# Patient Record
Sex: Female | Born: 1950
Health system: Southern US, Community
[De-identification: ages and names within clinical notes are randomized; demographics above are authoritative.]

## PROBLEM LIST (undated history)

## (undated) DIAGNOSIS — E079 Disorder of thyroid, unspecified: Secondary | ICD-10-CM

## (undated) DIAGNOSIS — H269 Unspecified cataract: Secondary | ICD-10-CM

## (undated) DIAGNOSIS — M940 Chondrocostal junction syndrome [Tietze]: Secondary | ICD-10-CM

## (undated) DIAGNOSIS — K219 Gastro-esophageal reflux disease without esophagitis: Secondary | ICD-10-CM

## (undated) DIAGNOSIS — M1990 Unspecified osteoarthritis, unspecified site: Secondary | ICD-10-CM

## (undated) DIAGNOSIS — I10 Essential (primary) hypertension: Secondary | ICD-10-CM

## (undated) DIAGNOSIS — E785 Hyperlipidemia, unspecified: Secondary | ICD-10-CM

## (undated) DIAGNOSIS — T7840XA Allergy, unspecified, initial encounter: Secondary | ICD-10-CM

## (undated) HISTORY — PX: COLONOSCOPY: SHX174

## (undated) HISTORY — DX: Chondrocostal junction syndrome (tietze): M94.0

## (undated) HISTORY — DX: Gastro-esophageal reflux disease without esophagitis: K21.9

## (undated) HISTORY — DX: Unspecified cataract: H26.9

## (undated) HISTORY — DX: Unspecified osteoarthritis, unspecified site: M19.90

## (undated) HISTORY — DX: Disorder of thyroid, unspecified: E07.9

## (undated) HISTORY — DX: Hyperlipidemia, unspecified: E78.5

## (undated) HISTORY — DX: Allergy, unspecified, initial encounter: T78.40XA

## (undated) HISTORY — PX: POLYPECTOMY: SHX149

## (undated) HISTORY — PX: THYROIDECTOMY: SHX17

## (undated) HISTORY — PX: DILATION AND CURETTAGE OF UTERUS: SHX78

## (undated) HISTORY — DX: Essential (primary) hypertension: I10

---

## 1998-03-09 ENCOUNTER — Ambulatory Visit (HOSPITAL_COMMUNITY): Admission: RE | Admit: 1998-03-09 | Discharge: 1998-03-09 | Payer: Self-pay | Admitting: Family Medicine

## 1998-03-09 ENCOUNTER — Encounter: Payer: Self-pay | Admitting: Family Medicine

## 1998-07-06 ENCOUNTER — Ambulatory Visit (HOSPITAL_COMMUNITY): Admission: RE | Admit: 1998-07-06 | Discharge: 1998-07-06 | Payer: Self-pay | Admitting: *Deleted

## 1999-07-02 ENCOUNTER — Ambulatory Visit (HOSPITAL_COMMUNITY): Admission: RE | Admit: 1999-07-02 | Discharge: 1999-07-02 | Payer: Self-pay | Admitting: Family Medicine

## 1999-07-02 ENCOUNTER — Encounter: Payer: Self-pay | Admitting: Family Medicine

## 2000-01-30 ENCOUNTER — Ambulatory Visit (HOSPITAL_COMMUNITY): Admission: RE | Admit: 2000-01-30 | Discharge: 2000-01-30 | Payer: Self-pay | Admitting: Cardiology

## 2000-02-01 ENCOUNTER — Ambulatory Visit (HOSPITAL_COMMUNITY): Admission: RE | Admit: 2000-02-01 | Discharge: 2000-02-01 | Payer: Self-pay | Admitting: Cardiology

## 2000-02-01 ENCOUNTER — Encounter: Payer: Self-pay | Admitting: Cardiology

## 2000-11-03 ENCOUNTER — Ambulatory Visit (HOSPITAL_COMMUNITY): Admission: RE | Admit: 2000-11-03 | Discharge: 2000-11-03 | Payer: Self-pay | Admitting: Family Medicine

## 2000-11-03 ENCOUNTER — Encounter: Payer: Self-pay | Admitting: Family Medicine

## 2001-11-12 ENCOUNTER — Encounter: Admission: RE | Admit: 2001-11-12 | Discharge: 2001-11-12 | Payer: Self-pay | Admitting: Obstetrics and Gynecology

## 2001-11-26 ENCOUNTER — Encounter: Admission: RE | Admit: 2001-11-26 | Discharge: 2001-11-26 | Payer: Self-pay | Admitting: Obstetrics and Gynecology

## 2002-01-05 ENCOUNTER — Ambulatory Visit (HOSPITAL_COMMUNITY): Admission: RE | Admit: 2002-01-05 | Discharge: 2002-01-05 | Payer: Self-pay | Admitting: Family Medicine

## 2002-01-05 ENCOUNTER — Encounter: Payer: Self-pay | Admitting: Family Medicine

## 2002-01-11 ENCOUNTER — Ambulatory Visit (HOSPITAL_COMMUNITY): Admission: RE | Admit: 2002-01-11 | Discharge: 2002-01-11 | Payer: Self-pay | Admitting: Family Medicine

## 2002-01-11 ENCOUNTER — Encounter: Payer: Self-pay | Admitting: Family Medicine

## 2003-03-03 ENCOUNTER — Other Ambulatory Visit: Admission: RE | Admit: 2003-03-03 | Discharge: 2003-03-03 | Payer: Self-pay | Admitting: Family Medicine

## 2003-03-04 ENCOUNTER — Ambulatory Visit (HOSPITAL_COMMUNITY): Admission: RE | Admit: 2003-03-04 | Discharge: 2003-03-04 | Payer: Self-pay | Admitting: Family Medicine

## 2004-03-23 ENCOUNTER — Ambulatory Visit (HOSPITAL_COMMUNITY): Admission: RE | Admit: 2004-03-23 | Discharge: 2004-03-23 | Payer: Self-pay | Admitting: Family Medicine

## 2004-06-25 ENCOUNTER — Ambulatory Visit: Payer: Self-pay | Admitting: Internal Medicine

## 2004-06-28 ENCOUNTER — Other Ambulatory Visit: Admission: RE | Admit: 2004-06-28 | Discharge: 2004-06-28 | Payer: Self-pay | Admitting: Family Medicine

## 2004-07-04 ENCOUNTER — Encounter (HOSPITAL_COMMUNITY): Admission: RE | Admit: 2004-07-04 | Discharge: 2004-10-02 | Payer: Self-pay | Admitting: Family Medicine

## 2004-08-22 ENCOUNTER — Other Ambulatory Visit: Admission: RE | Admit: 2004-08-22 | Discharge: 2004-08-22 | Payer: Self-pay | Admitting: Interventional Radiology

## 2004-08-22 ENCOUNTER — Encounter (INDEPENDENT_AMBULATORY_CARE_PROVIDER_SITE_OTHER): Payer: Self-pay | Admitting: *Deleted

## 2004-08-22 ENCOUNTER — Encounter: Admission: RE | Admit: 2004-08-22 | Discharge: 2004-08-22 | Payer: Self-pay | Admitting: General Surgery

## 2005-03-06 ENCOUNTER — Ambulatory Visit: Payer: Self-pay | Admitting: Internal Medicine

## 2005-03-13 ENCOUNTER — Ambulatory Visit (HOSPITAL_COMMUNITY): Admission: RE | Admit: 2005-03-13 | Discharge: 2005-03-13 | Payer: Self-pay | Admitting: Family Medicine

## 2005-09-19 ENCOUNTER — Encounter (INDEPENDENT_AMBULATORY_CARE_PROVIDER_SITE_OTHER): Payer: Self-pay | Admitting: Specialist

## 2005-09-19 ENCOUNTER — Ambulatory Visit (HOSPITAL_COMMUNITY): Admission: RE | Admit: 2005-09-19 | Discharge: 2005-09-20 | Payer: Self-pay | Admitting: General Surgery

## 2006-12-19 ENCOUNTER — Ambulatory Visit (HOSPITAL_COMMUNITY): Admission: RE | Admit: 2006-12-19 | Discharge: 2006-12-19 | Payer: Self-pay | Admitting: Family Medicine

## 2007-01-09 ENCOUNTER — Encounter: Admission: RE | Admit: 2007-01-09 | Discharge: 2007-01-09 | Payer: Self-pay | Admitting: Family Medicine

## 2007-12-27 ENCOUNTER — Emergency Department (HOSPITAL_COMMUNITY): Admission: EM | Admit: 2007-12-27 | Discharge: 2007-12-27 | Payer: Self-pay | Admitting: Family Medicine

## 2008-06-03 ENCOUNTER — Ambulatory Visit (HOSPITAL_COMMUNITY): Admission: RE | Admit: 2008-06-03 | Discharge: 2008-06-03 | Payer: Self-pay | Admitting: Family Medicine

## 2009-08-02 ENCOUNTER — Ambulatory Visit (HOSPITAL_COMMUNITY): Admission: RE | Admit: 2009-08-02 | Discharge: 2009-08-02 | Payer: Self-pay | Admitting: Family Medicine

## 2010-04-05 ENCOUNTER — Encounter: Payer: Self-pay | Admitting: Internal Medicine

## 2010-04-05 ENCOUNTER — Ambulatory Visit
Admission: RE | Admit: 2010-04-05 | Discharge: 2010-04-05 | Payer: Self-pay | Source: Home / Self Care | Attending: Internal Medicine | Admitting: Internal Medicine

## 2010-04-05 LAB — CONVERTED CEMR LAB
AST: 19 units/L (ref 0–37)
Sodium: 141 meq/L (ref 135–145)
Total Protein: 6.9 g/dL (ref 6.0–8.3)

## 2010-04-07 ENCOUNTER — Encounter: Payer: Self-pay | Admitting: Family Medicine

## 2010-04-09 ENCOUNTER — Ambulatory Visit
Admission: RE | Admit: 2010-04-09 | Discharge: 2010-04-09 | Payer: Self-pay | Source: Home / Self Care | Attending: Family Medicine | Admitting: Family Medicine

## 2010-04-09 DIAGNOSIS — M67919 Unspecified disorder of synovium and tendon, unspecified shoulder: Secondary | ICD-10-CM | POA: Insufficient documentation

## 2010-04-09 DIAGNOSIS — M719 Bursopathy, unspecified: Secondary | ICD-10-CM

## 2010-04-09 DIAGNOSIS — M6740 Ganglion, unspecified site: Secondary | ICD-10-CM | POA: Insufficient documentation

## 2010-04-19 NOTE — Assessment & Plan Note (Signed)
Summary: Jenna Fowler   Vital Signs:  Patient profile:   60 year old female Height:      68 inches Weight:      206 pounds BMI:     31.44 Pulse rate:   66 / minute BP sitting:   137 / 87  (left arm)  Vitals Entered By: Rochele Pages RN (April 09, 2010 2:25 PM) CC: rt shoulder pain x 5 days   CC:  rt shoulder pain x 5 days.  History of Present Illness: rigt shoulder pain esp with overhead activities for about 5 days. has had some problems before--never this bad. Rigjht hand dominant. Nurwse in OP clinic. No previous injury  no previous surgery  Pain is 4-8/10, worse with overhead activities. No weakness or numbness  right thumb has a mass noted for last couple of weeks. aggravating--mildly ttp. no new activities, no prior problems, no hx hand injury or surgery  Habits & Providers  Alcohol-Tobacco-Diet     Tobacco Status: never  Current Medications (verified): 1)  Vytorin 10-20 Mg Tabs (Ezetimibe-Simvastatin) .... Take 1 By Mouth Once Daily 2)  Hydrochlorothiazide 12.5 Mg Tabs (Hydrochlorothiazide) .... Take 1 By Mouth Once Daily 3)  Aspirin 81 Mg Tabs (Aspirin) .... Take 1 By Mouth Once Daily 4)  Zantac 150 Mg Tabs (Ranitidine Hcl) .... Take 1 By Mouth Once Daily  Allergies (verified): 1)  ! Erythromycin  Past History:  Risk Factors: Smoking Status: never (04/09/2010)  Family History: no hx Rheumatoid arthritis  Social History: Smoking Status:  never  Review of Systems  The patient denies anorexia and fever.    Physical Exam  General:  alert, well-developed, well-nourished, and well-hydrated.   Msk:  RIGHT shoulder RC muscles intact for strength in all planes. Pain with supraspinatus and IR testing. Distally neurovascaulry intact  right thumb firm 1 cm mass mcp joint. US shows cystic structure. No surrounding erythema or warmth   Impression & Recommendations:  Problem # 1:  ROTATOR CUFF SYNDROME, RIGHT (ICD-726.10) HEP, rtc 3 w. Consider  injection then --she did not wanttoday  Problem # 2:  GANGLION CYST (ICD-727.43) quick US reveals ganglion--discussed options. Will rtc as needed for formal US, likely aspioration and injection. Did not want to propceed today as she has to rtw and has to do a lot of typing  at her job  Complete Medication List: 1)  Vytorin 10-20 Mg Tabs (Ezetimibe-simvastatin) .... Take 1 by mouth once daily 2)  Hydrochlorothiazide 12.5 Mg Tabs (Hydrochlorothiazide) .... Take 1 by mouth once daily 3)  Aspirin 81 Mg Tabs (Aspirin) .... Take 1 by mouth once daily 4)  Zantac 150 Mg Tabs (Ranitidine hcl) .... Take 1 by mouth once daily   Orders Added: 1)  New Patient Level III [10272]

## 2010-04-25 NOTE — Miscellaneous (Signed)
Summary: Orders Update/per Adria Dill MD  Clinical Lists Changes  Problems: Added new problem of HYPERCALCEMIA (ICD-275.42) Orders: Added new Test order of T-CMP with estimated GFR ([id number]) - Signed Added new Test order of T-CMP with Estimated GFR ([id number]) - Signed     Process Orders Check Orders Results:     [facility]: ABN not required for this insurance Order queued for requisitioning for [facility]: April 05, 2010 9:58 AM Tests Sent for requisitioning (April 16, 2010 11:47 AM):     04/05/2010: [facility] -- T-CMP with Estimated GFR [[id number]] (signed)

## 2010-08-03 NOTE — Cardiovascular Report (Signed)
[facility]  Patient:    Jenna Fowler, Jenna Fowler                        MRN: [id number] Proc. Date: 02/01/00 Adm. Date:  [id number] Disc. Date: [id number] Attending:  Ophelia Shoulder CC:         Talmadge Coventry, M.D.  Cardiac Catheterization Laboratory   Cardiac Catheterization  PROCEDURES PERFORMED: 1.  Selective coronary angiography by Judkins technique. 2.  Retrograde left heart catheterization. 3.  Left ventricular angiography.  COMPLICATIONS:  None.  ENTRY SITE:  Right femoral artery.  DYE USED:  Omnipaque.  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  She recently had a Cardiolite stress test that showed mild anterolateral ischemia.  Todays outpatient cardiac catheterization was scheduled electively as an outpatient procedure.  RESULTS:  PRESSURES:  The left ventricular pressure was 160/26.  Central aortic pressure was 160/85, with a mean of 120.  No aortic valve gradient by pullback technique.  ANGIOGRAPHIC RESULTS: 1.  The left main coronary artery was virtually nonexistent.  The LAD and     circumflex arose by separate ostia. 2.  The left anterior descending artery coursed the cardiac apex and gave     rise to two tiny diagonal branches.  No lesions were noted. 3.  There was a medium sized intermediate branch which coursed almost all     the way to the anterolateral apical portion of the left ventricle.  No     lesions were seen. 4.  The circumflex itself consisted of posterolateral and posterior descending     branches and this vessel was dominant.  No lesions were seen. 5.  The right coronary artery was small and nondominant with no lesions noted. 6.  The left ventricle showed normal contractility, with no wall motion     abnormalities.  The ejection fraction estimate is 70%.  There was no     mitral regurgitation noted.  No evidence of mitral  prolapse.  There was     no left ventricular thrombus noted.  FINAL DIAGNOSES: 1.  Angiographically patent coronary arteries with a circumflex dominant     system. 2.  Normal left ventricular systolic function. DD:  02/01/00 TD:  02/01/00 Job: [id number]

## 2010-08-03 NOTE — Op Note (Signed)
Jenna Fowler, Jenna Fowler               ACCOUNT NO.:  [id number]   MEDICAL RECORD NO.:  [id number]          PATIENT TYPE:  AMB   LOCATION:  SDS                          FACILITY:  [facility]   PHYSICIAN:  Leonie Man, M.D.   DATE OF BIRTH:  1950/11/05   DATE OF PROCEDURE:  09/19/2005  DATE OF DISCHARGE:                                 OPERATIVE REPORT   PREOPERATIVE DIAGNOSIS:  Multinodular goiter.   POSTOPERATIVE DIAGNOSIS:  Multinodular goiter with pathology pending.   PROCEDURE:  Total thyroidectomy.   SURGEON:  Leonie Man, M.D.   ASSISTANCE:  Currie Paris, M.D.   ANESTHESIA:  General.   INDICATIONS:  Ms. Mohiuddin is a 60 year old woman with increasingly enlarged  thyroid with the right lobe extending up to the angle of the mandible and  down into the mediastinum and the left lobe being somewhat smaller,  extending upward to just below the mandibular angle and down to the sternal  notch.  The patient is having some problems with compression symptoms.  She  has significant tracheal deviation to the left.  She underwent biopsy of  this nodule which shows totally benign features consistent with a  multinodular goiter.  The patient comes to the operating room now after the  risks and potential benefits of surgery have been discussed including the  risk of hypoparathyroidism, hypothyroidism, as well as recurrent laryngeal  nerve injury.  She understands the risks and potential benefits and gives  her consent to surgery.   PROCEDURE:  Following the induction of satisfactory general anesthesia, the  patient was positioned supinely and head and neck hyperextended.  The neck  was prepped and draped to be included in a sterile operative field.  A  transverse collar incision is carried down in the clinic crease  approximately two fingerbreadths above the sternal notch.  This was deepened  through skin and subcutaneous tissue and across the placenta muscle.  Superior flaps were  raised up to the thyroid cartilage and an inferior flap  was carried down to the sternal notch.  The strap muscles are opened in  their midline carrying this down to the sternal notch and up to the thyroid  cartilage.  Dissection was carried down on the right side primarily where  the larger thyroid mass was located.  This was dissected free and mobilized  carrying the mobilization up to the superior pole where the superior pole  vessels were tied with 2-0 silk sutures and clipped.  The superior pole then  having been mobilized.  There was a large blood vessel to the pyramidal lobe  which was similarly doubly tied and clipped.  Dissection was carried down  along the thyroid taking blood vessels between clips to secure hemostasis at  the thyroid was carried forward.  The recurrent laryngeal nerve was  identified and protected throughout the course of the dissection.  The lower  pole parathyroid glands were also detected. parathyroid gland was also  protected and protected throughout the course of the dissection.  The upper  pole parathyroid gland was not clearly seen.  Dissection was carried then  across the trachea.  Tracheal midline and the lower pole vessels were taken  between clips and the thyroid IMA vessels were secured with medium clips.   Attention was then turned to the left side of the neck where similarly the  strap muscles were retracted laterally and the thyroid elevated from the  tracheal groove.  The dissection carried up toward the superior pole.  Superior pole vessels were identified and doubly tied and clipped.  The  middle thyroid vein was identified and doubly clipped.  Both the upper and  lower parathyroid vessels were identified during this dissection as well as  the recurrent laryngeal nerve.  Dissection carried forward maintaining  hemostasis with clips and as the thyroid was rolled medially toward the  isthmus.  The lower pole vessels were taken with clips and the  thyroid then  dissected free from the trachea, removed and forwarded for pathologic  evaluation.  Hemostasis was assured both with electrocautery and additional  clips.  Both the right and left neck were irrigated with normal saline and I  placed two Surgicel patches within the neck for additional hemostasis.  Sponge and instrument counts were verified.  The midline strap muscles  closed with running 3-0 Vicryl suture.  The platysma muscle closed with  interrupted 3-0 Vicryl sutures and the skin was closed with a running 5-0  Monocryl suture.  The wound was then steri-stripped.  Sterile dressings  applied.  The anesthetic reversed.  The patient removed from the operating  room to the recovery room in stable condition.  She tolerated the procedure  well.      Leonie Man, M.D.  Electronically Signed     PB/MEDQ  D:  09/19/2005  T:  09/19/2005  Job:  [id number]

## 2010-10-24 ENCOUNTER — Other Ambulatory Visit: Payer: Self-pay

## 2010-10-24 DIAGNOSIS — E039 Hypothyroidism, unspecified: Secondary | ICD-10-CM

## 2010-10-24 DIAGNOSIS — E785 Hyperlipidemia, unspecified: Secondary | ICD-10-CM

## 2010-10-24 DIAGNOSIS — E559 Vitamin D deficiency, unspecified: Secondary | ICD-10-CM

## 2010-10-24 DIAGNOSIS — I10 Essential (primary) hypertension: Secondary | ICD-10-CM

## 2010-10-24 LAB — LIPID PANEL
Cholesterol: 176 mg/dL (ref 0–200)
HDL: 57 mg/dL (ref >39)
Total CHOL/HDL Ratio: 3.1 Ratio
Triglycerides: 104 mg/dL (ref <150)

## 2010-10-24 LAB — TSH: TSH: 0.593 u[IU]/mL (ref 0.350–4.500)

## 2010-10-25 LAB — URINALYSIS, ROUTINE W REFLEX MICROSCOPIC
Glucose, UA: NEGATIVE mg/dL
Hgb urine dipstick: NEGATIVE
Ketones, ur: NEGATIVE mg/dL
Nitrite: NEGATIVE
Specific Gravity, Urine: 1.023 (ref 1.005–1.030)
pH: 5 (ref 5.0–8.0)

## 2010-10-25 LAB — COMPLETE METABOLIC PANEL WITH GFR
Calcium: 10.2 mg/dL (ref 8.4–10.5)
Chloride: 104 mEq/L (ref 96–112)
GFR, Est African American: >60 mL/min (ref >60)
Potassium: 3.8 mEq/L (ref 3.5–5.3)
Sodium: 139 mEq/L (ref 135–145)
Total Bilirubin: 0.7 mg/dL (ref 0.3–1.2)

## 2010-10-25 LAB — VITAMIN D 25 HYDROXY (VIT D DEFICIENCY, FRACTURES): Vit D, 25-Hydroxy: 32 ng/mL (ref 30–89)

## 2010-10-25 LAB — URINALYSIS, MICROSCOPIC ONLY
Bacteria, UA: NONE SEEN
Squamous Epithelial / LPF: NONE SEEN

## 2010-10-25 LAB — MICROALBUMIN / CREATININE URINE RATIO
Microalb Creat Ratio: 9.8 mg/g (ref 0.0–30.0)
Microalb, Ur: 2.18 mg/dL — ABNORMAL HIGH (ref 0.00–1.89)

## 2010-12-17 LAB — CULTURE, ROUTINE-ABSCESS

## 2011-10-24 ENCOUNTER — Ambulatory Visit (HOSPITAL_COMMUNITY)
Admission: RE | Admit: 2011-10-24 | Discharge: 2011-10-24 | Disposition: A | Discharge status: Home / Self Care | Payer: 59 | Source: Ambulatory Visit | Attending: Sports Medicine | Admitting: Sports Medicine

## 2011-10-24 ENCOUNTER — Ambulatory Visit (INDEPENDENT_AMBULATORY_CARE_PROVIDER_SITE_OTHER): Payer: 59 | Admitting: Sports Medicine

## 2011-10-24 VITALS — BP 139/74 | Ht 67.0 in | Wt 215.0 lb

## 2011-10-24 DIAGNOSIS — M25569 Pain in unspecified knee: Secondary | ICD-10-CM

## 2011-10-24 DIAGNOSIS — M25562 Pain in left knee: Secondary | ICD-10-CM

## 2011-10-24 DIAGNOSIS — S82009A Unspecified fracture of unspecified patella, initial encounter for closed fracture: Secondary | ICD-10-CM

## 2011-10-24 MED ORDER — MELOXICAM 15 MG PO TABS: 15.0000 mg | ORAL_TABLET | Freq: Every day | ORAL | Status: AC

## 2011-10-24 NOTE — Progress Notes (Signed)
Subjective:  Jenna Fowler is a pleasant 61 y/o AAF who presents to clinic with acute left knee pain following a fall one week ago.  One week ago she states she was walking up onto a side walk and fell on her left knee.  She has some pain acutely but noted no swelling.  She scraped a small portion of her knee just below the patella.  She has been having more anterior knee pain since, which is worse at the end of the day and with prolonged standing.  She did note small amount of swelling initially but this resolved very quickly with elevation.  She denies and locking, popping or feeling of instability on her knee.  No history of trauma or injury to her knee before.  Denies any leg numbness or tingling, no weakness.  She has been using tylenol arthritis and alternating with Ibuprofen which is giving her moderate relief of her pain.  She was advise to be seen given she is still having some discomfort.    OBJ: Filed Vitals:   10/24/11 0951  BP: 139/74    Gen: NAD, well-developed, well-nourished. Awake alert and oriented x3. Left Knee: no visible deformity or swelling noted.  Has full ROM.  Neurovascularly intact distally. TTP over inferior patellar tendon, some mild medial tibial joint line tenderness.  Negative patellar apprehension test and the patella isn't ballotable.  MCL, LCL, PCL and ACL intact with firm endpoints and without tenderness.  McMurray's difficult to assess 2/2 to patient inability to relax leg, however does have some mild discomfort in anterior knee with hop test and Thessaly's.  Normal reflexes and sensation. Skin is intact. Neurovascularly intact distally.  Knee ultrasound - views over the patellar tendon proximally show mild inflammation.  Has a possible small patellar avulsion vs. Bone spur distally.  No appreciation for increase vascularization over patella or patellar tendon.  A/P Left knee pain with likely bone contusion, patellar tendonitis and possible bone spur vs. Avulsion on  distal patella Will obtain plain films of left knee to better asses for possible avulsion vs. Bone spur vs. Possible arthritis to better explain patient's pain.  Will treat with rest, ice, elevation.  Have patient keep a compression sleeve over knee for comfort and support.  Will have patient use Mobic for antiinflammatory along with tylenol as needed.  Plan to f/u on plain films.  Gave patient instruction on expected management and to continue routine activities.  Plan to have patient follow up as needed in 2-3 weeks if pain not improving or having any other concerning symptoms that aren't resolving.  Patient understands plan and agrees.  Plan Addendum -   F/u on Plain films - shows mild narrowing of medial joint compartment. Small lucency at the inferolateral aspect of the patella suspicious for  nondisplaced fracture.  Will have patient return to clinic and place in knee immobilizer for 3 weeks.  Plan to f/u in 3 weeks and re-image. Three month temporary handicap placard given.

## 2011-10-24 NOTE — Addendum Note (Signed)
Addended by: Annita Brod on: 10/24/2011 01:59 PM   Modules accepted: Orders

## 2011-10-28 ENCOUNTER — Encounter: Payer: Self-pay | Admitting: *Deleted

## 2011-11-13 ENCOUNTER — Ambulatory Visit (HOSPITAL_COMMUNITY)
Admission: RE | Admit: 2011-11-13 | Discharge: 2011-11-13 | Disposition: A | Discharge status: Home / Self Care | Payer: 59 | Source: Ambulatory Visit | Attending: Sports Medicine | Admitting: Sports Medicine

## 2011-11-13 DIAGNOSIS — X58XXXA Exposure to other specified factors, initial encounter: Secondary | ICD-10-CM | POA: Insufficient documentation

## 2011-11-13 DIAGNOSIS — M25562 Pain in left knee: Secondary | ICD-10-CM

## 2011-11-13 DIAGNOSIS — S82009A Unspecified fracture of unspecified patella, initial encounter for closed fracture: Secondary | ICD-10-CM | POA: Insufficient documentation

## 2011-11-14 ENCOUNTER — Encounter: Payer: Self-pay | Admitting: Sports Medicine

## 2011-11-14 ENCOUNTER — Ambulatory Visit (INDEPENDENT_AMBULATORY_CARE_PROVIDER_SITE_OTHER): Payer: 59 | Admitting: Sports Medicine

## 2011-11-14 VITALS — BP 125/82 | HR 67 | Ht 69.0 in | Wt 215.0 lb

## 2011-11-14 DIAGNOSIS — M25569 Pain in unspecified knee: Secondary | ICD-10-CM

## 2011-11-14 DIAGNOSIS — S82009A Unspecified fracture of unspecified patella, initial encounter for closed fracture: Secondary | ICD-10-CM

## 2011-11-14 DIAGNOSIS — M25562 Pain in left knee: Secondary | ICD-10-CM

## 2011-11-15 NOTE — Progress Notes (Signed)
  Subjective:    Patient ID: Jenna Fowler, female    DOB: 06/12/1950, 61 y.o.   MRN: [id number]  HPI Patient comes in today for followup on a left patellar fracture. Overall she feels like she is improving. Swelling has decreased. She is in a knee immobilizer. She is without complaint today.    Review of Systems     Objective:   Physical Exam Well-developed, well-nourished. No acute distress  Left knee: No effusion, no soft tissue swelling. Extensor mechanism is intact. Mild tenderness to palpation over the inferior pole of the patella. Neurovascular intact distally. Fully weightbearing in her knee immobilizer.  X-rays of the left knee done on August 28 were independently reviewed by me. There's been no change in her fracture position. Fracture remains nondisplaced.       Assessment & Plan:  1. 3 weeks status post nondisplaced patella fracture, left knee  Continue in the immobilizer for 3 more weeks. We will repeat x-rays prior to her followup visit in 3 weeks time. She will call with questions or concerns in the interim.

## 2011-12-04 ENCOUNTER — Ambulatory Visit: Payer: 59 | Admitting: Sports Medicine

## 2011-12-04 ENCOUNTER — Ambulatory Visit (HOSPITAL_COMMUNITY)
Admission: RE | Admit: 2011-12-04 | Discharge: 2011-12-04 | Disposition: A | Discharge status: Home / Self Care | Payer: 59 | Source: Ambulatory Visit | Attending: Sports Medicine | Admitting: Sports Medicine

## 2011-12-04 DIAGNOSIS — M25562 Pain in left knee: Secondary | ICD-10-CM

## 2011-12-04 DIAGNOSIS — Z09 Encounter for follow-up examination after completed treatment for conditions other than malignant neoplasm: Secondary | ICD-10-CM | POA: Insufficient documentation

## 2011-12-05 ENCOUNTER — Ambulatory Visit (INDEPENDENT_AMBULATORY_CARE_PROVIDER_SITE_OTHER): Payer: 59 | Admitting: Sports Medicine

## 2011-12-05 DIAGNOSIS — S82009A Unspecified fracture of unspecified patella, initial encounter for closed fracture: Secondary | ICD-10-CM

## 2011-12-06 NOTE — Progress Notes (Signed)
  Subjective:    Patient ID: Jenna Fowler, female    DOB: 29-Aug-1950, 61 y.o.   MRN: [id number]  HPI chief complaint: Followup on patella fracture  Patient comes in today for followup on her left patellar fracture. She's doing well. Minimal pain. She's been in a knee immobilizer for 6 weeks. She is without complaint.    Review of Systems     Objective:   Physical Exam Well-developed, well-nourished. No acute distress. Awake alert and oriented x3  Left knee: Range of motion 0-90. No effusion. No soft tissue swelling. Minimal tenderness over the inferior pole patella but not markedly. Extensor mechanism is intact. Skin is intact. She is neurovascular intact distally and fully weightbearing in her knee immobilizer.  X-rays of her left knee including AP and lateral views dated 12/04/2011 are reviewed. The fracture previously seen at the inferior pole the patella is now healed. She still has a rather prominent degenerative spur. No joint effusion.       Assessment & Plan:  1. Radiographically healed nondisplaced left patella fracture  Discontinue the immobilizer in favor of a simple double hinged knee brace. Start physical therapy to increase motion and strengthening as tolerated. Followup with me in 4 weeks for reevaluation. Call with questions or concerns in the interim.

## 2011-12-17 ENCOUNTER — Ambulatory Visit: Payer: 59 | Attending: Sports Medicine | Admitting: Physical Therapy

## 2011-12-17 DIAGNOSIS — M25569 Pain in unspecified knee: Secondary | ICD-10-CM | POA: Insufficient documentation

## 2011-12-17 DIAGNOSIS — IMO0001 Reserved for inherently not codable concepts without codable children: Secondary | ICD-10-CM | POA: Insufficient documentation

## 2011-12-17 DIAGNOSIS — M25669 Stiffness of unspecified knee, not elsewhere classified: Secondary | ICD-10-CM | POA: Insufficient documentation

## 2011-12-26 ENCOUNTER — Encounter: Payer: 59 | Admitting: Physical Therapy

## 2011-12-27 ENCOUNTER — Encounter: Payer: 59 | Admitting: Rehabilitation

## 2012-01-01 ENCOUNTER — Ambulatory Visit: Payer: 59 | Admitting: Physical Therapy

## 2012-01-02 ENCOUNTER — Ambulatory Visit (INDEPENDENT_AMBULATORY_CARE_PROVIDER_SITE_OTHER): Payer: 59 | Admitting: Sports Medicine

## 2012-01-02 VITALS — BP 128/80 | Ht 68.0 in | Wt 215.0 lb

## 2012-01-02 DIAGNOSIS — S82009A Unspecified fracture of unspecified patella, initial encounter for closed fracture: Secondary | ICD-10-CM

## 2012-01-02 NOTE — Progress Notes (Signed)
  Subjective:    Patient ID: Jenna Fowler, female    DOB: Jun 02, 1950, 61 y.o.   MRN: [id number]  HPI Patient comes in today for followup on left patellar fracture. Injury occurred at the beginning of August. She is in a double upright brace and has attended 2 sessions of physical therapy. She feels like she is 20% improved since her last office visit. Denies significant pain with walking. No swelling. No feelings of instability. She is without complaint today.    Review of Systems     Objective:   Physical Exam Well-developed, well-nourished. No acute distress  Left knee: Range of motion is 0-120 to 130 degrees. No effusion. No soft tissue swelling. She is still tender to palpation over the inferior pole of the patella. Extensor mechanism is intact. Moderate amount of quad atrophy on the left compared to the right. She walks with a slight limp.  X-rays one month ago showed a healed fracture       Assessment & Plan:  1. Status post left patellar fracture  Patient will continue with physical therapy. In 2 weeks she will be 3 months out from her injury and I will allow her to transition into a body helix compression sleeve as long as the knee is not giving way. I will see her in 6 weeks for followup she is instructed to call me in the interim if she does not continue to improve.

## 2012-01-08 ENCOUNTER — Ambulatory Visit: Payer: 59 | Admitting: Physical Therapy

## 2012-01-15 ENCOUNTER — Ambulatory Visit: Payer: 59 | Admitting: Physical Therapy

## 2012-01-22 ENCOUNTER — Ambulatory Visit: Payer: 59 | Attending: Internal Medicine | Admitting: Physical Therapy

## 2012-01-22 DIAGNOSIS — M25669 Stiffness of unspecified knee, not elsewhere classified: Secondary | ICD-10-CM | POA: Insufficient documentation

## 2012-01-22 DIAGNOSIS — IMO0001 Reserved for inherently not codable concepts without codable children: Secondary | ICD-10-CM | POA: Insufficient documentation

## 2012-01-22 DIAGNOSIS — M25569 Pain in unspecified knee: Secondary | ICD-10-CM | POA: Insufficient documentation

## 2012-02-17 ENCOUNTER — Ambulatory Visit (INDEPENDENT_AMBULATORY_CARE_PROVIDER_SITE_OTHER): Payer: 59 | Admitting: Sports Medicine

## 2012-02-17 ENCOUNTER — Encounter: Payer: Self-pay | Admitting: Sports Medicine

## 2012-02-17 VITALS — BP 121/79 | HR 76 | Ht 68.0 in | Wt 215.0 lb

## 2012-02-17 DIAGNOSIS — S82009A Unspecified fracture of unspecified patella, initial encounter for closed fracture: Secondary | ICD-10-CM

## 2012-02-17 NOTE — Progress Notes (Signed)
  Subjective:    Patient ID: Jenna Fowler, female    DOB: 11/01/1950, 61 y.o.   MRN: [id number]  HPI  Patient is 61 yo F s/p fall in August 2013 presenting for follow up for patellar fracture of left knee. She has been in body helix brace for 2-3 weeks now. She also finished her PT 2 weeks ago. She has been doing some home exercises. She is now able to walk, climb stairs and kneel with very little discomfort. A few days ago she was standing more than usual and had pain in knee which improved with Mobic, but otherwise she has no complaints. She denies weakness of leg or sensation of her knee giving out.    Review of Systems Negative except as noted in HPI above    Objective:   Physical Exam  Left knee: Mild quad atrophy compared to right. Range of motion 0-120 degrees. + crepitus of knee. Minimal tenderness to palpation of distal patella/patellar tendon. 5/5 strength. Walking without a limp.     Assessment & Plan:   61 yo F s/p left patellar fracture, healing well  1. Continue to use body helix brace. (Given Medium size brace today; large was too big) 2. Continue exercises to increase quad strength. At this time, no need for further PT sessions 3. Continue Mobic as needed for pain/inflammation 4. Return to clinic as needed   [employer] [place], M.D. 02/17/2012 11:58 AM

## 2012-02-20 ENCOUNTER — Encounter: Payer: Self-pay | Admitting: Sports Medicine

## 2012-02-20 ENCOUNTER — Ambulatory Visit (INDEPENDENT_AMBULATORY_CARE_PROVIDER_SITE_OTHER): Payer: 59 | Admitting: Sports Medicine

## 2012-02-20 VITALS — BP 122/80 | HR 67 | Ht 68.0 in | Wt 215.0 lb

## 2012-02-20 DIAGNOSIS — M25569 Pain in unspecified knee: Secondary | ICD-10-CM

## 2012-02-21 DIAGNOSIS — M25569 Pain in unspecified knee: Secondary | ICD-10-CM | POA: Insufficient documentation

## 2012-02-21 NOTE — Assessment & Plan Note (Signed)
For next 2 weeks use her knee support on the RT knee This seems to help Use Ice Prn OTC medicines for pain  Simple SLR, lateral leg raise, and quad sets  Sports insoles to cushion shoes more  Reck in 2 weeks and [place] if not improving

## 2012-02-21 NOTE — Progress Notes (Signed)
Patient ID: Jenna Fowler, female   DOB: 1950-12-26, 61 y.o.   MRN: [id number]  Patient enters for RT knee swelling and moderate pain This arose without any trauma She did a lot of walking in some shoes that did not feel that good  She had seen Dr Margaretha Sheffield recnelty for left patellar fx that healed well  This was not assoc with a fall as was her patellar fx  She works in [employer] and stands on hard floors Feels better in sports shoes  Examination  NAD  RT Knee - mild puffiness and TTP along medial joint line Otherwise Palpation normal with no warmth or joint line tenderness or patellar tenderness or condyle tenderness. ROM normal in extension Mild limitation of flexion and pain at 125 deg Mild pain with lower leg rotation. Ligaments with solid consistent endpoints including ACL, PCL, LCL, MCL. Positive Mcmurray's and provocative meniscal tests. Non painful patellar compression. Patellar and quadriceps tendons unremarkable. Hamstring and quadriceps strength is normal.  Able to walk without limp

## 2012-03-19 ENCOUNTER — Other Ambulatory Visit (HOSPITAL_COMMUNITY): Payer: Self-pay | Admitting: Internal Medicine

## 2012-03-19 DIAGNOSIS — Z1231 Encounter for screening mammogram for malignant neoplasm of breast: Secondary | ICD-10-CM

## 2012-04-03 ENCOUNTER — Ambulatory Visit (HOSPITAL_COMMUNITY)
Admission: RE | Admit: 2012-04-03 | Discharge: 2012-04-03 | Disposition: A | Discharge status: Home / Self Care | Payer: 59 | Source: Ambulatory Visit | Attending: Internal Medicine | Admitting: Internal Medicine

## 2012-04-03 DIAGNOSIS — Z1231 Encounter for screening mammogram for malignant neoplasm of breast: Secondary | ICD-10-CM | POA: Insufficient documentation

## 2013-02-26 ENCOUNTER — Other Ambulatory Visit: Payer: Self-pay

## 2013-02-26 DIAGNOSIS — Z1231 Encounter for screening mammogram for malignant neoplasm of breast: Secondary | ICD-10-CM

## 2013-03-08 ENCOUNTER — Other Ambulatory Visit: Payer: Self-pay | Admitting: *Deleted

## 2013-03-08 MED ORDER — MELOXICAM 15 MG PO TABS: 15.0000 mg | ORAL_TABLET | Freq: Every day | ORAL | Status: DC

## 2013-04-05 ENCOUNTER — Ambulatory Visit
Admission: RE | Admit: 2013-04-05 | Discharge: 2013-04-05 | Disposition: A | Discharge status: Home / Self Care | Payer: 59 | Source: Ambulatory Visit

## 2013-04-05 DIAGNOSIS — Z1231 Encounter for screening mammogram for malignant neoplasm of breast: Secondary | ICD-10-CM

## 2013-05-19 ENCOUNTER — Other Ambulatory Visit (HOSPITAL_COMMUNITY)
Admission: RE | Admit: 2013-05-19 | Discharge: 2013-05-19 | Disposition: A | Discharge status: Home / Self Care | Payer: 59 | Source: Ambulatory Visit | Attending: Internal Medicine | Admitting: Internal Medicine

## 2013-05-19 ENCOUNTER — Other Ambulatory Visit: Payer: Self-pay | Admitting: Internal Medicine

## 2013-05-19 DIAGNOSIS — Z01419 Encounter for gynecological examination (general) (routine) without abnormal findings: Secondary | ICD-10-CM | POA: Insufficient documentation

## 2013-05-19 DIAGNOSIS — Z1151 Encounter for screening for human papillomavirus (HPV): Secondary | ICD-10-CM | POA: Insufficient documentation

## 2013-08-18 ENCOUNTER — Encounter: Payer: Self-pay | Admitting: Internal Medicine

## 2014-07-28 ENCOUNTER — Other Ambulatory Visit: Payer: Self-pay

## 2014-07-28 DIAGNOSIS — Z1231 Encounter for screening mammogram for malignant neoplasm of breast: Secondary | ICD-10-CM

## 2014-08-03 ENCOUNTER — Encounter: Payer: Self-pay | Admitting: Internal Medicine

## 2014-08-19 ENCOUNTER — Ambulatory Visit
Admission: RE | Admit: 2014-08-19 | Discharge: 2014-08-19 | Disposition: A | Discharge status: Home / Self Care | Payer: 59 | Source: Ambulatory Visit

## 2014-08-19 DIAGNOSIS — Z1231 Encounter for screening mammogram for malignant neoplasm of breast: Secondary | ICD-10-CM

## 2014-09-30 ENCOUNTER — Ambulatory Visit (AMBULATORY_SURGERY_CENTER): Payer: Self-pay | Admitting: *Deleted

## 2014-09-30 VITALS — Ht 68.0 in | Wt 199.0 lb

## 2014-09-30 DIAGNOSIS — Z1211 Encounter for screening for malignant neoplasm of colon: Secondary | ICD-10-CM

## 2014-09-30 NOTE — Progress Notes (Signed)
No egg or soy allergy After thyroid surgery pt had nausea but no other issues with sedation No diet pills No home 02 emmi video to e mail

## 2014-10-10 ENCOUNTER — Telehealth: Payer: Self-pay | Admitting: Internal Medicine

## 2014-10-10 ENCOUNTER — Other Ambulatory Visit: Payer: Self-pay

## 2014-10-10 MED ORDER — POLYETHYLENE GLYCOL 3350 17 GM/SCOOP PO POWD: ORAL | Status: DC

## 2014-10-10 MED ORDER — BISACODYL 5 MG PO TBEC: 5.0000 mg | DELAYED_RELEASE_TABLET | Freq: Every day | ORAL | Status: DC | PRN

## 2014-10-10 NOTE — Telephone Encounter (Signed)
Rx sent to the pharmacy as per the prep instructions.

## 2014-10-14 ENCOUNTER — Encounter: Payer: 59 | Admitting: Internal Medicine

## 2015-04-13 MED FILL — AMLODIPINE BESYLATE 2.5 MG: 2.5 | 90 days supply | Qty: 90 | Fill #1

## 2015-04-13 MED FILL — LEVOTHYROXINE 88 MCG TABLET: 88 | 60 days supply | Qty: 60 | Fill #1

## 2015-04-19 DIAGNOSIS — E039 Hypothyroidism, unspecified: Secondary | ICD-10-CM | POA: Diagnosis not present

## 2015-05-17 MED FILL — raNITIdine HCL 150 MG TABS: 150 | 90 days supply | Qty: 90 | Fill #1

## 2015-06-14 MED FILL — LEVOTHYROXINE 88 MCG TABLET: 88 | 60 days supply | Qty: 60 | Fill #0

## 2015-07-06 MED FILL — EZETIMIBE 10 MG TABLET: 10 | 90 days supply | Qty: 90 | Fill #1

## 2015-07-06 MED FILL — SIMVASTATIN 20 MG TABLET: 20 | 90 days supply | Qty: 90 | Fill #1

## 2015-07-17 MED FILL — AMLODIPINE BESYLATE 2.5 MG: 2.5 | 90 days supply | Qty: 90 | Fill #0

## 2015-08-09 MED FILL — raNITIdine HCL 150 MG TABS: 150 | 90 days supply | Qty: 90 | Fill #2

## 2015-08-09 MED FILL — LEVOTHYROXINE 88 MCG TABLET: 88 | 60 days supply | Qty: 60 | Fill #1

## 2015-08-10 MED FILL — MELOXICAM 7.5 MG TABLET: 7.5 | 30 days supply | Qty: 30 | Fill #0

## 2015-10-06 MED FILL — LEVOTHYROXINE 88 MCG TABLET: 88 | 60 days supply | Qty: 60 | Fill #2

## 2015-10-10 ENCOUNTER — Other Ambulatory Visit (HOSPITAL_COMMUNITY)
Admission: RE | Admit: 2015-10-10 | Discharge: 2015-10-10 | Disposition: A | Discharge status: Home / Self Care | Payer: 59 | Source: Ambulatory Visit | Attending: Internal Medicine | Admitting: Internal Medicine

## 2015-10-10 ENCOUNTER — Other Ambulatory Visit: Payer: Self-pay | Admitting: Internal Medicine

## 2015-10-10 DIAGNOSIS — Z01419 Encounter for gynecological examination (general) (routine) without abnormal findings: Secondary | ICD-10-CM | POA: Insufficient documentation

## 2015-10-10 DIAGNOSIS — I10 Essential (primary) hypertension: Secondary | ICD-10-CM | POA: Diagnosis not present

## 2015-10-10 DIAGNOSIS — E669 Obesity, unspecified: Secondary | ICD-10-CM | POA: Diagnosis not present

## 2015-10-10 DIAGNOSIS — E785 Hyperlipidemia, unspecified: Secondary | ICD-10-CM | POA: Diagnosis not present

## 2015-10-10 DIAGNOSIS — E039 Hypothyroidism, unspecified: Secondary | ICD-10-CM | POA: Diagnosis not present

## 2015-10-10 DIAGNOSIS — R7309 Other abnormal glucose: Secondary | ICD-10-CM | POA: Diagnosis not present

## 2015-10-10 DIAGNOSIS — Z6829 Body mass index (BMI) 29.0-29.9, adult: Secondary | ICD-10-CM | POA: Diagnosis not present

## 2015-10-10 DIAGNOSIS — K219 Gastro-esophageal reflux disease without esophagitis: Secondary | ICD-10-CM | POA: Diagnosis not present

## 2015-10-10 DIAGNOSIS — Z0000 Encounter for general adult medical examination without abnormal findings: Secondary | ICD-10-CM | POA: Diagnosis not present

## 2015-10-12 LAB — CYTOLOGY - PAP

## 2015-10-16 MED FILL — AMLODIPINE BESYLATE 2.5 MG: 2.5 | 90 days supply | Qty: 90 | Fill #1

## 2015-10-23 MED FILL — EZETIMIBE 10 MG TABLET: 10 | 90 days supply | Qty: 90 | Fill #2

## 2015-10-23 MED FILL — SIMVASTATIN 20 MG TABLET: 20 | 90 days supply | Qty: 90 | Fill #2

## 2015-11-28 MED FILL — raNITIdine HCL 150 MG TABS: 150 | 90 days supply | Qty: 90 | Fill #3

## 2015-12-08 MED FILL — LEVOTHYROXINE 88 MCG TABLET: 88 | 60 days supply | Qty: 60 | Fill #3

## 2016-01-15 MED FILL — AMLODIPINE BESYLATE 2.5 MG: 2.5 | 90 days supply | Qty: 90 | Fill #0

## 2016-02-05 MED FILL — SIMVASTATIN 20 MG TABLET: 20 | 90 days supply | Qty: 90 | Fill #3

## 2016-02-05 MED FILL — EZETIMIBE 10 MG TABLET: 10 | 90 days supply | Qty: 90 | Fill #3

## 2016-02-06 MED FILL — LEVOTHYROXINE 88 MCG TABLET: 88 | 90 days supply | Qty: 90 | Fill #0

## 2016-02-19 ENCOUNTER — Other Ambulatory Visit: Payer: Self-pay | Admitting: Internal Medicine

## 2016-02-19 DIAGNOSIS — Z1231 Encounter for screening mammogram for malignant neoplasm of breast: Secondary | ICD-10-CM

## 2016-02-21 MED FILL — MELOXICAM 7.5 MG TABLET: 7.5 | 30 days supply | Qty: 30 | Fill #1

## 2016-03-05 MED FILL — raNITIdine HCL 150 MG TABS: 150 | 90 days supply | Qty: 90 | Fill #0

## 2016-03-18 HISTORY — PX: COLONOSCOPY: SHX174

## 2016-04-01 ENCOUNTER — Ambulatory Visit
Admission: RE | Admit: 2016-04-01 | Discharge: 2016-04-01 | Disposition: A | Discharge status: Home / Self Care | Payer: 59 | Source: Ambulatory Visit | Attending: Internal Medicine | Admitting: Internal Medicine

## 2016-04-01 DIAGNOSIS — Z1231 Encounter for screening mammogram for malignant neoplasm of breast: Secondary | ICD-10-CM

## 2016-04-15 MED FILL — AMLODIPINE BESYLATE 2.5 MG: 2.5 | 90 days supply | Qty: 90 | Fill #1

## 2016-04-29 MED FILL — MELOXICAM 7.5 MG TABLET: 7.5 | 30 days supply | Qty: 30 | Fill #2

## 2016-05-07 MED FILL — LEVOTHYROXINE 88 MCG TABLET: 88 | 90 days supply | Qty: 90 | Fill #1

## 2016-06-11 MED FILL — SIMVASTATIN 20 MG TABLET: 20 | 90 days supply | Qty: 90 | Fill #0

## 2016-06-11 MED FILL — raNITIdine HCL 150 MG TABS: 150 | 90 days supply | Qty: 90 | Fill #0

## 2016-06-11 MED FILL — EZETIMIBE 10 MG TAB: 10 | 90 days supply | Qty: 90 | Fill #0

## 2016-06-12 MED FILL — MELOXICAM 7.5 MG TABLET: 7.5 | 30 days supply | Qty: 30 | Fill #3

## 2016-07-15 MED FILL — AMLODIPINE BESYLATE 2.5 MG: 2.5 | 90 days supply | Qty: 90 | Fill #2

## 2016-08-05 MED FILL — LEVOTHYROXINE 88 MCG TABLET: 88 | 90 days supply | Qty: 90 | Fill #0

## 2016-08-05 MED FILL — MELOXICAM 7.5 MG TABLET: 7.5 | 30 days supply | Qty: 30 | Fill #4

## 2016-09-20 ENCOUNTER — Encounter: Payer: Self-pay | Admitting: Gastroenterology

## 2016-10-09 MED FILL — raNITIdine HCL 150 MG TABS: 150 | 90 days supply | Qty: 90 | Fill #1

## 2016-10-09 MED FILL — SIMVASTATIN 20 MG TABLET: 20 | 90 days supply | Qty: 90 | Fill #1

## 2016-10-09 MED FILL — EZETIMIBE 10 MG TAB: 10 | 90 days supply | Qty: 90 | Fill #1

## 2016-10-14 MED FILL — AMLODIPINE BESYLATE 2.5 MG: 2.5 | 90 days supply | Qty: 90 | Fill #0

## 2016-10-30 DIAGNOSIS — M19019 Primary osteoarthritis, unspecified shoulder: Secondary | ICD-10-CM | POA: Diagnosis not present

## 2016-10-30 DIAGNOSIS — E039 Hypothyroidism, unspecified: Secondary | ICD-10-CM | POA: Diagnosis not present

## 2016-10-30 DIAGNOSIS — K219 Gastro-esophageal reflux disease without esophagitis: Secondary | ICD-10-CM | POA: Diagnosis not present

## 2016-10-30 DIAGNOSIS — I10 Essential (primary) hypertension: Secondary | ICD-10-CM | POA: Diagnosis not present

## 2016-10-30 DIAGNOSIS — Z6830 Body mass index (BMI) 30.0-30.9, adult: Secondary | ICD-10-CM | POA: Diagnosis not present

## 2016-10-30 DIAGNOSIS — J301 Allergic rhinitis due to pollen: Secondary | ICD-10-CM | POA: Diagnosis not present

## 2016-10-30 DIAGNOSIS — E785 Hyperlipidemia, unspecified: Secondary | ICD-10-CM | POA: Diagnosis not present

## 2016-10-30 DIAGNOSIS — E669 Obesity, unspecified: Secondary | ICD-10-CM | POA: Diagnosis not present

## 2016-10-30 MED FILL — LEVOTHYROXINE 88 MCG TABLET: 88 | 90 days supply | Qty: 90 | Fill #0

## 2016-10-30 MED FILL — FLUTICASONE PROP 50 MCG SPR: 50 | 60 days supply | Qty: 16 | Fill #0

## 2016-11-25 ENCOUNTER — Ambulatory Visit (AMBULATORY_SURGERY_CENTER): Payer: Self-pay | Admitting: *Deleted

## 2016-11-25 VITALS — Ht 67.5 in | Wt 195.0 lb

## 2016-11-25 DIAGNOSIS — Z1211 Encounter for screening for malignant neoplasm of colon: Secondary | ICD-10-CM

## 2016-11-25 NOTE — Progress Notes (Signed)
Patient denies any allergies to eggs or soy. Patient denies any problems with sedation. Patient denies any oxygen use at home and does not take any diet/weight loss medications. EMMI education assisgned to patient on colonoscopy, this was explained and instructions given to patient.

## 2016-11-25 NOTE — Progress Notes (Signed)
Patient request Miralax prep, she has already purchased the prep.

## 2016-11-28 ENCOUNTER — Encounter: Payer: Self-pay | Admitting: Gastroenterology

## 2016-12-06 ENCOUNTER — Ambulatory Visit (INDEPENDENT_AMBULATORY_CARE_PROVIDER_SITE_OTHER): Payer: 59 | Admitting: Family Medicine

## 2016-12-06 ENCOUNTER — Ambulatory Visit: Payer: Self-pay

## 2016-12-06 VITALS — BP 118/80 | Ht 67.0 in | Wt 195.0 lb

## 2016-12-06 DIAGNOSIS — M25561 Pain in right knee: Secondary | ICD-10-CM

## 2016-12-06 NOTE — Progress Notes (Signed)
   Subjective:    Patient ID: Jenna Fowler, female    DOB: 04-24-1950, 66 y.o.   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 She states the inciting event was this past Tuesday when she was walking up the steps and she fell forward impacting her bilateral knees against the concrete step. Immediately afterwards, she noticed a superficial abrasion on her right knee and associated knee pain anteriorly and swelling. She was able to get up afterwards and ambulate with only mild difficulty. Since Tuesday, she notes significant improvement in her pain and swelling. She rates her pain at a 3 out of 10 in nature and is nonradiating. She has been taking Tylenol arthritis pain for her symptoms which does help. She states that her pain is worse at night, when walking, and when climbing stairs. She has a known history of osteoarthritis. Other associated symptoms include: No associated numbness or tingling. She does state mild weakness is present of her right knee. Denies any hip pain or ankle pain. Denies any loss of consciousness or impact on her head. States she has not injured her right knee previously. She does have a home prescription for meloxicam but has not taken this yet.      Review of Systems  Musculoskeletal: Positive for arthralgias (Right knee), gait problem (Mild limp) and joint swelling (Right knee). Negative for back pain, myalgias and neck pain.  Neurological: Positive for weakness (Right knee). Negative for numbness and headaches.       Objective:   Physical Exam  Constitutional: She is oriented to person, place, and time. She appears well-developed and well-nourished. No distress.  Musculoskeletal:  Trace effusion on inspection of the right lower extremity. Superficial abrasion approximately a quarter of a centimeter on the anterior knee, healing. Tenderness to  palpation over the inferior medial aspect of the patella. No patellofemoral joint line tenderness. Patient has full active and passive range of motion and knee flexion and extension. Knee flexion +5 out of 5 strength, knee extension +5 out of 5 strength. Negative Lachman test. Negative McMurray testing. Negative patellar grind. Negative varus valgus stress. Sensation is intact to light touch L4-S1. Dorsalis pedis pulse +2 out of 4.  Neurological: She is alert and oriented to person, place, and time.  Ambulating without difficulty or assistance    Limited diagnostic ultrasound: Right lower extremity ultrasound demonstrates trace prepatellar edema, quadriceps tendon intact at insertion on patella. Patellar tendon intact origin and insertion. Upon further imaging of the patella, there is a mild bone spur at its origin on the inferior aspect of the patella. No apparent cortical abnormalities visualized.      Assessment & Plan:  Right patellar contusion:Mild.  She has normal flexion and extension.  There is no effusion in the suprapatellar pouch noted on ultrasound.  I think this will resolve normally, if she continues to have problems or has new or worsening symptoms she'll return otherwise she can return when necessary

## 2016-12-09 ENCOUNTER — Encounter: Payer: Self-pay | Admitting: Gastroenterology

## 2016-12-09 ENCOUNTER — Ambulatory Visit (AMBULATORY_SURGERY_CENTER): Payer: 59 | Admitting: Gastroenterology

## 2016-12-09 VITALS — BP 111/67 | HR 48 | Temp 97.3°F | Resp 12 | Ht 67.5 in | Wt 195.0 lb

## 2016-12-09 DIAGNOSIS — K219 Gastro-esophageal reflux disease without esophagitis: Secondary | ICD-10-CM | POA: Diagnosis not present

## 2016-12-09 DIAGNOSIS — Z1211 Encounter for screening for malignant neoplasm of colon: Secondary | ICD-10-CM

## 2016-12-09 DIAGNOSIS — D123 Benign neoplasm of transverse colon: Secondary | ICD-10-CM | POA: Diagnosis not present

## 2016-12-09 DIAGNOSIS — D122 Benign neoplasm of ascending colon: Secondary | ICD-10-CM | POA: Diagnosis not present

## 2016-12-09 DIAGNOSIS — I10 Essential (primary) hypertension: Secondary | ICD-10-CM | POA: Diagnosis not present

## 2016-12-09 MED ORDER — SODIUM CHLORIDE 0.9 % IV SOLN: 500.0000 mL | INTRAVENOUS | Status: DC

## 2016-12-09 NOTE — Progress Notes (Signed)
Called to room to assist during endoscopic procedure.  Patient ID and intended procedure confirmed with present staff. Received instructions for my participation in the procedure from the performing physician.  

## 2016-12-09 NOTE — Patient Instructions (Signed)
YOU HAD AN ENDOSCOPIC PROCEDURE TODAY AT [facility]:   Refer to the procedure report that was given to you for any specific questions about what was found during the examination.  If the procedure report does not answer your questions, please call your gastroenterologist to clarify.  If you requested that your care partner not be given the details of your procedure findings, then the procedure report has been included in a sealed envelope for you to review at your convenience later.  YOU SHOULD EXPECT: Some feelings of bloating in the abdomen. Passage of more gas than usual.  Walking can help get rid of the air that was put into your GI tract during the procedure and reduce the bloating. If you had a lower endoscopy (such as a colonoscopy or flexible sigmoidoscopy) you may notice spotting of blood in your stool or on the toilet paper. If you underwent a bowel prep for your procedure, you may not have a normal bowel movement for a few days.  Please Note:  You might notice some irritation and congestion in your nose or some drainage.  This is from the oxygen used during your procedure.  There is no need for concern and it should clear up in a day or so.  SYMPTOMS TO REPORT IMMEDIATELY:   Following lower endoscopy (colonoscopy or flexible sigmoidoscopy):  Excessive amounts of blood in the stool  Significant tenderness or worsening of abdominal pains  Swelling of the abdomen that is new, acute  Fever of 100F or higher  For urgent or emergent issues, a gastroenterologist can be reached at any hour by calling [phone].   DIET:  We do recommend a small meal at first, but then you may proceed to your regular diet.  Drink plenty of fluids but you should avoid alcoholic beverages for 24 hours.  ACTIVITY:  You should plan to take it easy for the rest of today and you should NOT DRIVE or use heavy machinery until tomorrow (because of the sedation medicines used during the test).     FOLLOW UP: Our staff will call the number listed on your records the next business day following your procedure to check on you and address any questions or concerns that you may have regarding the information given to you following your procedure. If we do not reach you, we will leave a message.  However, if you are feeling well and you are not experiencing any problems, there is no need to return our call.  We will assume that you have returned to your regular daily activities without incident.  If any biopsies were taken you will be contacted by phone or by letter within the next 1-3 weeks.  Please call us at [phone] if you have not heard about the biopsies in 3 weeks.   Await for biopsy results to determine next repeat Colonoscopy screening Polyps (handout given) Hemorrhoids (handout given) Diverticulosis(handout given)   SIGNATURES/CONFIDENTIALITY: You and/or your care partner have signed paperwork which will be entered into your electronic medical record.  These signatures attest to the fact that that the information above on your After Visit Summary has been reviewed and is understood.  Full responsibility of the confidentiality of this discharge information lies with you and/or your care-partner.

## 2016-12-09 NOTE — Progress Notes (Signed)
Pt's states no medical or surgical changes since previsit or office visit. maw 

## 2016-12-09 NOTE — Op Note (Signed)
[facility] Patient Name: Jenna Fowler Procedure Date: 12/09/2016 10:16 AM MRN: [id number] Endoscopist: Mauri Pole , MD Age: 66 Referring MD:  Date of Birth: 09-25-1950 Gender: Female Account #: [id number] Procedure:                Colonoscopy Indications:              Screening for colorectal malignant neoplasm Medicines:                Monitored Anesthesia Care Procedure:                Pre-Anesthesia Assessment:                           - Prior to the procedure, a History and Physical                            was performed, and patient medications and                            allergies were reviewed. The patient's tolerance of                            previous anesthesia was also reviewed. The risks                            and benefits of the procedure and the sedation                            options and risks were discussed with the patient.                            All questions were answered, and informed consent                            was obtained. Prior Anticoagulants: The patient has                            taken no previous anticoagulant or antiplatelet                            agents. ASA Grade Assessment: II - A patient with                            mild systemic disease. After reviewing the risks                            and benefits, the patient was deemed in                            satisfactory condition to undergo the procedure.                           After obtaining informed consent, the colonoscope  was passed under direct vision. Throughout the                            procedure, the patient's blood pressure, pulse, and                            oxygen saturations were monitored continuously. The                            Colonoscope was introduced through the anus and                            advanced to the the cecum, identified by                            appendiceal orifice  and ileocecal valve. The                            colonoscopy was performed without difficulty. The                            patient tolerated the procedure well. The quality                            of the bowel preparation was excellent. The                            ileocecal valve, appendiceal orifice, and rectum                            were photographed. Scope In: 10:17:33 AM Scope Out: 10:33:56 AM Scope Withdrawal Time: 0 hours 11 minutes 49 seconds  Total Procedure Duration: 0 hours 16 minutes 23 seconds  Findings:                 The perianal and digital rectal examinations were                            normal.                           A 9 mm polyp was found in the ascending colon. The                            polyp was sessile. The polyp was removed with a                            cold snare. Resection and retrieval were complete.                           A 15 mm polyp was found in the transverse colon.                            The polyp was semi-pedunculated. The polyp was  removed with a hot snare. Resection and retrieval                            were complete.                           Scattered small and large-mouthed diverticula were                            found in the sigmoid colon, descending colon and                            ascending colon.                           Non-bleeding internal hemorrhoids were found during                            retroflexion. The hemorrhoids were small. Complications:            No immediate complications. Estimated Blood Loss:     Estimated blood loss was minimal. Impression:               - One 9 mm polyp in the ascending colon, removed                            with a cold snare. Resected and retrieved.                           - One 15 mm polyp in the transverse colon, removed                            with a hot snare. Resected and retrieved.                           -  Diverticulosis in the sigmoid colon, in the                            descending colon and in the ascending colon.                           - Non-bleeding internal hemorrhoids. Recommendation:           - Patient has a contact number available for                            emergencies. The signs and symptoms of potential                            delayed complications were discussed with the                            patient. Return to normal activities tomorrow.                            Written discharge  instructions were provided to the                            patient.                           - Resume previous diet.                           - Continue present medications.                           - Await pathology results.                           - Repeat colonoscopy in 3 years for surveillance                            based on pathology results. Mauri Pole, MD 12/09/2016 10:40:25 AM This report has been signed electronically.

## 2016-12-09 NOTE — Progress Notes (Signed)
Report given to PACU, vss 

## 2016-12-10 ENCOUNTER — Telehealth: Payer: Self-pay

## 2016-12-10 NOTE — Telephone Encounter (Signed)
  Follow up Call-  Call Arlyne Brandes number 12/09/2016  Post procedure Call Jaclyne Haverstick phone  # [phone] cell  Permission to leave phone message Yes  Some recent data might be hidden     Patient questions:  Do you have a fever, pain , or abdominal swelling? No. Pain Score  0 *  Have you tolerated food without any problems? Yes.    Have you been able to return to your normal activities? Yes.    Do you have any questions about your discharge instructions: Diet   No. Medications  No. Follow up visit  No.  Do you have questions or concerns about your Care? No.  Actions: * If pain score is 4 or above: No action needed, pain <4.

## 2016-12-13 ENCOUNTER — Encounter: Payer: Self-pay | Admitting: Family Medicine

## 2016-12-13 ENCOUNTER — Encounter: Payer: Self-pay | Admitting: Gastroenterology

## 2017-01-03 ENCOUNTER — Ambulatory Visit (INDEPENDENT_AMBULATORY_CARE_PROVIDER_SITE_OTHER): Payer: 59 | Admitting: Podiatry

## 2017-01-03 ENCOUNTER — Encounter: Payer: Self-pay | Admitting: Podiatry

## 2017-01-03 ENCOUNTER — Telehealth: Payer: Self-pay | Admitting: Podiatry

## 2017-01-03 VITALS — BP 115/66 | HR 62 | Resp 16

## 2017-01-03 DIAGNOSIS — B351 Tinea unguium: Secondary | ICD-10-CM

## 2017-01-03 DIAGNOSIS — L600 Ingrowing nail: Secondary | ICD-10-CM

## 2017-01-03 DIAGNOSIS — Z79899 Other long term (current) drug therapy: Secondary | ICD-10-CM | POA: Diagnosis not present

## 2017-01-03 NOTE — Telephone Encounter (Signed)
I just had a little procedure done by Dr. Jacqualyn Posey and was wanting to know if I can get a note to wear this little boot to work incase I need to on Monday. You can call me back at [phone]. Thank you.

## 2017-01-03 NOTE — Patient Instructions (Signed)

## 2017-01-03 NOTE — Telephone Encounter (Signed)
Left message informing pt that if she needed a note to wear the boot on Monday to call again, but most patients were able to wear their loosest regular shoe to work without problems.

## 2017-01-03 NOTE — Progress Notes (Signed)
Subjective:    Patient ID: Jenna Fowler, female    DOB: [birth year], 66 y.o.   MRN: [id number]  HPI Ms. Dutan Presents the office today for concerns of pain to her left big toenail mostly with pressure in shoes. She denies any drainage or pus coming from the area. She has been soaking in Epson salts with minimal relief. Is an ongoing for about 2 months. She also states all of her nails are discolored and thick. She states this been ongoing for several years. She said no recent treatment for this either. She has no other concerns today.   Review of Systems  All other systems reviewed and are negative.  Past Medical History:  Diagnosis Date  . Allergy    occasionally  . Cataract    watching the beggining of one  . Costochondritis, acute    of chest wall  . GERD (gastroesophageal reflux disease)   . Hyperlipidemia   . Hypertension   . Osteoarthritis   . Thyroid disease     Past Surgical History:  Procedure Laterality Date  . COLONOSCOPY    . DILATION AND CURETTAGE OF UTERUS    . THYROIDECTOMY       Current Outpatient Prescriptions:  .  amLODipine (NORVASC) 2.5 MG tablet, Take 2.5 mg by mouth daily., Disp: , Rfl:  .  aspirin 81 MG tablet, Take 81 mg by mouth daily., Disp: , Rfl:  .  BIOTIN PO, Take 1,000 mg by mouth daily., Disp: , Rfl:  .  cetirizine (ZYRTEC) 10 MG tablet, Take 10 mg by mouth daily., Disp: , Rfl:  .  Chromium Picolinate 800 MCG TABS, Take 800 mcg by mouth daily. With cinnamon, Disp: , Rfl:  .  CINNAMON PO, Take 2,000 mg by mouth daily., Disp: , Rfl:  .  ezetimibe-simvastatin (VYTORIN) 10-20 MG per tablet, Take 1 tablet by mouth at bedtime., Disp: , Rfl:  .  levothyroxine (SYNTHROID) 100 MCG tablet, Take 88 mcg by mouth daily. , Disp: , Rfl:  .  meloxicam (MOBIC) 15 MG tablet, Take 1 tablet (15 mg total) by mouth daily., Disp: 40 tablet, Rfl: 2 .  ranitidine (ZANTAC) 150 MG capsule, Take 150 mg by mouth daily. , Disp: , Rfl:  .  Vitamin D,  Cholecalciferol, 1000 UNITS CAPS, Take 1,000 Units by mouth daily., Disp: , Rfl:  .  terbinafine (LAMISIL) 250 MG tablet, Take 1 tablet (250 mg total) by mouth daily., Disp: 90 tablet, Rfl: 0  Allergies  Allergen Reactions  . Erythromycin Diarrhea    Social History   Social History  . Marital status: Widowed    Spouse name: N/A  . Number of children: N/A  . Years of education: N/A   Occupational History  . Not on file.   Social History Main Topics  . Smoking status: Never Smoker  . Smokeless tobacco: Never Used  . Alcohol use No  . Drug use: No  . Sexual activity: Not on file   Other Topics Concern  . Not on file   Social History Narrative  . No narrative on file         Objective:   Physical Exam  General: AAO x3, NAD  Dermatological: There is incurvation present on both medial and lateral aspect left hallux toenail is tenderness palpation mostly on the medial aspect. There is malaise edema no question there is no erythema, drainage or pus or any clinical signs of infection present. There is tenderness to the nail borders.  All the nails appear to be dystrophic, discolored yellow to brown discoloration. There is no pain surrounding toenails otherwise there is no clinical signs of infection. Is no open lesions identified today.  Vascular: Dorsalis Pedis artery and Posterior Tibial artery pedal pulses are 2/4 bilateral with immedate capillary fill time. Pedal hair growth present. No varicosities and no lower extremity edema present bilateral. There is no pain with calf compression, swelling, warmth, erythema.   Neruologic: Grossly intact via light touch bilateral. Protective threshold with Semmes Wienstein monofilament intact to all pedal sites bilateral.   Musculoskeletal: No gross boney pedal deformities bilateral. No pain, crepitus, or limitation noted with foot and ankle range of motion bilateral. Muscular strength 5/5 in all groups tested bilateral.  Gait:  Unassisted, Nonantalgic.      Assessment & Plan:  66 year old female with left symptomatic ingrown toenail, onychomycosis -Treatment options discussed including all alternatives, risks, and complications -Etiology of symptoms were discussed At this time, the patient is requesting partial nail removal with chemical matricectomy to the symptomatic portion of the nail. Risks and complications were discussed with the patient for which they understand and written consent was obtained. Under sterile conditions a total of 3 mL of a mixture of 2% lidocaine plain and 0.5% Marcaine plain was infiltrated in a hallux block fashion. Once anesthetized, the skin was prepped in sterile fashion. A tourniquet was then applied. Next the medial and laterals aspect of hallux nail border was then sharply excised making sure to remove the entire offending nail border. Once the nails were ensured to be removed area was debrided and the underlying skin was intact. There is no purulence identified in the procedure. Next phenol was then applied under standard conditions and copiously irrigated. Silvadene was applied. A dry sterile dressing was applied. After application of the dressing the tourniquet was removed and there is found to be an immediate capillary refill time to the digit. The patient tolerated the procedure well any complications. Post procedure instructions were discussed the patient for which he verbally understood. Follow-up in one week for nail check or sooner if any problems are to arise. Discussed signs/symptoms of infection and directed to call the office immediately should any occur or go directly to the emergency room. In the meantime, encouraged to call the office with any questions, concerns, changes symptoms. -Discussion options for nail fungus at this point she elects to proceed with oral Lamisil. Discussed side effects the medication. We'll check LFTs and CBC prior starting the medication.  Celesta Gentile, DPM

## 2017-01-04 LAB — CBC WITH DIFFERENTIAL/PLATELET
BASOS ABS: 0 10*3/uL (ref 0.0–0.2)
Basos: 0 %
EOS (ABSOLUTE): 0.1 10*3/uL (ref 0.0–0.4)
Eos: 1 %
HEMOGLOBIN: 13 g/dL (ref 11.1–15.9)
Hematocrit: 39.6 % (ref 34.0–46.6)
IMMATURE GRANS (ABS): 0 10*3/uL (ref 0.0–0.1)
IMMATURE GRANULOCYTES: 0 %
LYMPHS: 29 %
Lymphocytes Absolute: 1.4 10*3/uL (ref 0.7–3.1)
MCH: 29.5 pg (ref 26.6–33.0)
MCHC: 32.8 g/dL (ref 31.5–35.7)
MCV: 90 fL (ref 79–97)
MONOCYTES: 7 %
Monocytes Absolute: 0.3 10*3/uL (ref 0.1–0.9)
NEUTROS ABS: 3.1 10*3/uL (ref 1.4–7.0)
NEUTROS PCT: 63 %
PLATELETS: 271 10*3/uL (ref 150–379)
RBC: 4.41 x10E6/uL (ref 3.77–5.28)
RDW: 13.2 % (ref 12.3–15.4)
WBC: 4.9 10*3/uL (ref 3.4–10.8)

## 2017-01-04 LAB — HEPATIC FUNCTION PANEL
ALT: 10 IU/L (ref 0–32)
AST: 16 IU/L (ref 0–40)
Albumin: 4.3 g/dL (ref 3.6–4.8)
Alkaline Phosphatase: 86 IU/L (ref 39–117)
BILIRUBIN TOTAL: 0.4 mg/dL (ref 0.0–1.2)
Bilirubin, Direct: 0.1 mg/dL (ref 0.00–0.40)
Total Protein: 7.6 g/dL (ref 6.0–8.5)

## 2017-01-05 MED ORDER — TERBINAFINE HCL 250 MG PO TABS: 250.0000 mg | ORAL_TABLET | Freq: Every day | ORAL | 0 refills | Status: DC

## 2017-01-06 DIAGNOSIS — L600 Ingrowing nail: Secondary | ICD-10-CM | POA: Insufficient documentation

## 2017-01-06 DIAGNOSIS — B351 Tinea unguium: Secondary | ICD-10-CM | POA: Insufficient documentation

## 2017-01-06 MED FILL — TERBINAFINE HCL 250 MG TAB: 250 | 90 days supply | Qty: 90 | Fill #0

## 2017-01-09 ENCOUNTER — Telehealth: Payer: Self-pay | Admitting: *Deleted

## 2017-01-09 NOTE — Telephone Encounter (Signed)
Unable to leave a message mailbox is full and not enough room to leave a message.

## 2017-01-09 NOTE — Telephone Encounter (Signed)
-----   Message from Trula Slade, DPM sent at 01/05/2017  5:51 PM EDT ----- Blood wok normal can start lamisil. Please let her know. Thanks.

## 2017-01-10 ENCOUNTER — Ambulatory Visit (INDEPENDENT_AMBULATORY_CARE_PROVIDER_SITE_OTHER): Payer: Self-pay | Admitting: Podiatry

## 2017-01-10 ENCOUNTER — Encounter: Payer: Self-pay | Admitting: Podiatry

## 2017-01-10 DIAGNOSIS — Z79899 Other long term (current) drug therapy: Secondary | ICD-10-CM

## 2017-01-10 DIAGNOSIS — L600 Ingrowing nail: Secondary | ICD-10-CM

## 2017-01-10 NOTE — Patient Instructions (Signed)
Recheck blood work in 1 month  Continue soaking in epsom salts twice a day followed by antibiotic ointment and a band-aid. Can leave uncovered at night. Continue this until completely healed.  If the area has not healed in 2 weeks, call the office for follow-up appointment, or sooner if any problems arise.  Monitor for any signs/symptoms of infection. Call the office immediately if any occur or go directly to the emergency room. Call with any questions/concerns.

## 2017-01-10 NOTE — Progress Notes (Signed)
Subjective: Jenna Fowler is a 66 y.o.  female returns to office today for follow up evaluation after having left Hallux partial  nail avulsion performed. Patient has been soaking using epsom salts and applying topical antibiotic covered with bandaid daily. She denies any pain, swelling, drainage or pus. She is also start the Lamisil and she is notany side effects. Patient denies fevers, chills, nausea, vomiting. Denies any calf pain, chest pain, SOB.   Objective:  Vitals: Reviewed  General: Well developed, nourished, in no acute distress, alert and oriented x3   Dermatology: Skin is warm, dry and supple bilateral. Left hallux nail border appears to be clean, dry, with mild granular tissue and surrounding scab. There is no surrounding erythema, edema, drainage/purulence. The remaining nails appear unremarkable at this time. There are no other lesions or other signs of infection present.  Neurovascular status: Intact. No lower extremity swelling; No pain with calf compression bilateral.  Musculoskeletal: No tenderness to palpation of the medial and lateral hallux nail folds. Muscular strength within normal limits bilateral.   Assesement and Plan: S/p partial nail avulsion, doing well.   -Continue soaking in epsom salts twice a day followed by antibiotic ointment and a band-aid. Can leave uncovered at night. Continue this until completely healed.  -Continue Lamisil and monitor for any side effects. I went ahead and gave her a lab paperwork to get LFT and CBC rechecked in about 4 weeks. -If the area has not healed in 2 weeks, call the office for follow-up appointment, or sooner if any problems arise.  -Monitor for any signs/symptoms of infection. Call the office immediately if any occur or go directly to the emergency room. Call with any questions/concerns.  Celesta Gentile, DPM

## 2017-01-10 NOTE — Telephone Encounter (Signed)
Left message on pt's mobile phone with Dr. Leigh Aurora review of results.

## 2017-01-10 NOTE — Telephone Encounter (Signed)
-----   Message from Trula Slade, DPM sent at 01/05/2017  5:51 PM EDT ----- Blood wok normal can start lamisil. Please let her know. Thanks.

## 2017-01-15 DIAGNOSIS — Z833 Family history of diabetes mellitus: Secondary | ICD-10-CM | POA: Diagnosis not present

## 2017-01-15 DIAGNOSIS — Z780 Asymptomatic menopausal state: Secondary | ICD-10-CM | POA: Diagnosis not present

## 2017-01-15 DIAGNOSIS — Z23 Encounter for immunization: Secondary | ICD-10-CM | POA: Diagnosis not present

## 2017-01-15 DIAGNOSIS — K219 Gastro-esophageal reflux disease without esophagitis: Secondary | ICD-10-CM | POA: Diagnosis not present

## 2017-01-15 DIAGNOSIS — I10 Essential (primary) hypertension: Secondary | ICD-10-CM | POA: Diagnosis not present

## 2017-01-15 DIAGNOSIS — Z0000 Encounter for general adult medical examination without abnormal findings: Secondary | ICD-10-CM | POA: Diagnosis not present

## 2017-01-15 DIAGNOSIS — E785 Hyperlipidemia, unspecified: Secondary | ICD-10-CM | POA: Diagnosis not present

## 2017-01-15 DIAGNOSIS — E039 Hypothyroidism, unspecified: Secondary | ICD-10-CM | POA: Diagnosis not present

## 2017-01-15 DIAGNOSIS — Z9889 Other specified postprocedural states: Secondary | ICD-10-CM | POA: Diagnosis not present

## 2017-01-15 DIAGNOSIS — R7303 Prediabetes: Secondary | ICD-10-CM | POA: Diagnosis not present

## 2017-01-15 MED FILL — MELOXICAM 7.5 MG TABLET: 7.5 | 30 days supply | Qty: 30 | Fill #0

## 2017-01-15 MED FILL — raNITIdine HCL 150 MG TABS: 150 | 90 days supply | Qty: 90 | Fill #0

## 2017-01-15 MED FILL — AMLODIPINE BESYLATE 2.5 MG: 2.5 | 90 days supply | Qty: 90 | Fill #0

## 2017-01-22 ENCOUNTER — Other Ambulatory Visit: Payer: Self-pay | Admitting: Internal Medicine

## 2017-01-22 DIAGNOSIS — Z1231 Encounter for screening mammogram for malignant neoplasm of breast: Secondary | ICD-10-CM

## 2017-01-22 DIAGNOSIS — Z780 Asymptomatic menopausal state: Secondary | ICD-10-CM

## 2017-01-30 MED FILL — SIMVASTATIN 20 MG TABLET: 20 | 90 days supply | Qty: 90 | Fill #0

## 2017-01-30 MED FILL — EZETIMIBE 10 MG TABS: 10 | 90 days supply | Qty: 90 | Fill #0

## 2017-01-30 MED FILL — LEVOTHYROXINE 88 MCG TABLET: 88 | 90 days supply | Qty: 90 | Fill #0

## 2017-02-11 DIAGNOSIS — Z79899 Other long term (current) drug therapy: Secondary | ICD-10-CM | POA: Diagnosis not present

## 2017-02-12 LAB — HEPATIC FUNCTION PANEL
ALK PHOS: 89 IU/L (ref 39–117)
ALT: 8 IU/L (ref 0–32)
AST: 16 IU/L (ref 0–40)
Albumin: 4 g/dL (ref 3.6–4.8)
Bilirubin Total: 0.4 mg/dL (ref 0.0–1.2)
Bilirubin, Direct: 0.1 mg/dL (ref 0.00–0.40)
Total Protein: 7.4 g/dL (ref 6.0–8.5)

## 2017-04-03 ENCOUNTER — Ambulatory Visit: Payer: 59

## 2017-04-03 ENCOUNTER — Other Ambulatory Visit: Payer: 59

## 2017-04-07 ENCOUNTER — Ambulatory Visit
Admission: RE | Admit: 2017-04-07 | Discharge: 2017-04-07 | Disposition: A | Discharge status: Home / Self Care | Payer: 59 | Source: Ambulatory Visit | Attending: Internal Medicine | Admitting: Internal Medicine

## 2017-04-07 DIAGNOSIS — Z1231 Encounter for screening mammogram for malignant neoplasm of breast: Secondary | ICD-10-CM | POA: Diagnosis not present

## 2017-04-07 DIAGNOSIS — Z13820 Encounter for screening for osteoporosis: Secondary | ICD-10-CM | POA: Diagnosis not present

## 2017-04-07 DIAGNOSIS — Z780 Asymptomatic menopausal state: Secondary | ICD-10-CM

## 2017-04-10 MED FILL — MELOXICAM 7.5 MG TABLET: 7.5 | 30 days supply | Qty: 30 | Fill #1

## 2017-04-17 MED FILL — AMLODIPINE BESYLATE 2.5 MG: 2.5 | 90 days supply | Qty: 90 | Fill #1

## 2017-04-17 MED FILL — raNITIdine HCL 150 MG TABS: 150 | 90 days supply | Qty: 90 | Fill #1

## 2017-05-02 MED FILL — LEVOTHYROXINE 88 MCG TABLET: 88 | 90 days supply | Qty: 90 | Fill #1

## 2017-06-10 MED FILL — EZETIMIBE 10 MG TABLET: 10 | 90 days supply | Qty: 90 | Fill #2

## 2017-06-10 MED FILL — SIMVASTATIN 20 MG TABLET: 20 | 90 days supply | Qty: 90 | Fill #1

## 2017-06-11 MED FILL — MELOXICAM 7.5 MG TABLET: 7.5 | 30 days supply | Qty: 30 | Fill #2

## 2017-06-16 DIAGNOSIS — J3489 Other specified disorders of nose and nasal sinuses: Secondary | ICD-10-CM | POA: Insufficient documentation

## 2017-06-16 DIAGNOSIS — R040 Epistaxis: Secondary | ICD-10-CM | POA: Insufficient documentation

## 2017-06-16 DIAGNOSIS — Z972 Presence of dental prosthetic device (complete) (partial): Secondary | ICD-10-CM | POA: Diagnosis not present

## 2017-07-21 MED FILL — AMLODIPINE BESYLATE 2.5 MG: 2.5 | 90 days supply | Qty: 90 | Fill #2

## 2017-07-28 MED FILL — raNITIdine HCL 150 MG TABS: 150 | 90 days supply | Qty: 90 | Fill #2

## 2017-07-28 MED FILL — LEVOTHYROXINE 88 MCG TABLET: 88 | 90 days supply | Qty: 90 | Fill #2

## 2017-10-10 MED FILL — EZETIMIBE 10 MG TABLET: 10 | 90 days supply | Qty: 90 | Fill #0

## 2017-10-10 MED FILL — SIMVASTATIN 20 MG TABLET: 20 | 90 days supply | Qty: 90 | Fill #2

## 2017-10-27 MED FILL — MELOXICAM 7.5 MG TABLET: 7.5 | 30 days supply | Qty: 30 | Fill #0

## 2017-10-27 MED FILL — LEVOTHYROXINE 88 MCG TABLET: 88 | 90 days supply | Qty: 90 | Fill #3

## 2017-10-27 MED FILL — AMLODIPINE 2.5 MG TABLET: 2.5 | 90 days supply | Qty: 90 | Fill #3

## 2017-11-03 MED FILL — raNITIdine HCL 150 MG TABS: 150 | 90 days supply | Qty: 90 | Fill #3

## 2018-01-27 ENCOUNTER — Other Ambulatory Visit: Payer: Self-pay | Admitting: Internal Medicine

## 2018-01-27 DIAGNOSIS — Z1231 Encounter for screening mammogram for malignant neoplasm of breast: Secondary | ICD-10-CM

## 2018-01-27 MED FILL — AMLODIPINE 2.5 MG TABLET: 2.5 | 90 days supply | Qty: 90 | Fill #0

## 2018-01-27 MED FILL — MELOXICAM 7.5 MG TABLET: 7.5 | 30 days supply | Qty: 30 | Fill #0

## 2018-01-27 MED FILL — LEVOTHYROXINE 88 MCG TABLET: 88 | 90 days supply | Qty: 90 | Fill #0

## 2018-02-20 DIAGNOSIS — R7303 Prediabetes: Secondary | ICD-10-CM | POA: Diagnosis not present

## 2018-02-20 DIAGNOSIS — E039 Hypothyroidism, unspecified: Secondary | ICD-10-CM | POA: Diagnosis not present

## 2018-02-20 DIAGNOSIS — Z1239 Encounter for other screening for malignant neoplasm of breast: Secondary | ICD-10-CM | POA: Diagnosis not present

## 2018-02-20 DIAGNOSIS — Z0000 Encounter for general adult medical examination without abnormal findings: Secondary | ICD-10-CM | POA: Diagnosis not present

## 2018-02-20 DIAGNOSIS — Z1211 Encounter for screening for malignant neoplasm of colon: Secondary | ICD-10-CM | POA: Diagnosis not present

## 2018-02-20 DIAGNOSIS — K219 Gastro-esophageal reflux disease without esophagitis: Secondary | ICD-10-CM | POA: Diagnosis not present

## 2018-02-20 DIAGNOSIS — M19019 Primary osteoarthritis, unspecified shoulder: Secondary | ICD-10-CM | POA: Diagnosis not present

## 2018-02-20 DIAGNOSIS — I10 Essential (primary) hypertension: Secondary | ICD-10-CM | POA: Diagnosis not present

## 2018-02-20 DIAGNOSIS — E785 Hyperlipidemia, unspecified: Secondary | ICD-10-CM | POA: Diagnosis not present

## 2018-02-20 MED FILL — SIMVASTATIN 20 MG TABLET: 20 | 90 days supply | Qty: 90 | Fill #0

## 2018-02-20 MED FILL — EZETIMIBE 10 MG TABS: 10 | 90 days supply | Qty: 90 | Fill #0

## 2018-03-16 DIAGNOSIS — H524 Presbyopia: Secondary | ICD-10-CM | POA: Diagnosis not present

## 2018-03-16 DIAGNOSIS — H5213 Myopia, bilateral: Secondary | ICD-10-CM | POA: Diagnosis not present

## 2018-04-13 ENCOUNTER — Ambulatory Visit
Admission: RE | Admit: 2018-04-13 | Discharge: 2018-04-13 | Disposition: A | Discharge status: Home / Self Care | Payer: 59 | Source: Ambulatory Visit | Attending: Internal Medicine | Admitting: Internal Medicine

## 2018-04-13 DIAGNOSIS — Z1231 Encounter for screening mammogram for malignant neoplasm of breast: Secondary | ICD-10-CM | POA: Diagnosis not present

## 2018-04-27 MED FILL — AMLODIPINE 2.5 MG TABLET: 2.5 | 90 days supply | Qty: 90 | Fill #1 | Status: TO

## 2018-05-04 MED FILL — LEVOTHYROXINE 88 MCG TABLET: 88 | 90 days supply | Qty: 90 | Fill #1 | Status: TO

## 2018-05-28 MED FILL — SIMVASTATIN 20 MG TABLET: 20 | 90 days supply | Qty: 90 | Fill #1

## 2018-05-28 MED FILL — EZETIMIBE 10 MG TABS: 10 | 90 days supply | Qty: 90 | Fill #1

## 2018-07-20 MED FILL — LEVOTHYROXINE 88 MCG TABLET: 88 | 90 days supply | Qty: 90 | Fill #0

## 2018-07-20 MED FILL — AMLODIPINE 2.5 MG TABLET: 2.5 | 90 days supply | Qty: 90 | Fill #0

## 2018-08-25 DIAGNOSIS — I10 Essential (primary) hypertension: Secondary | ICD-10-CM | POA: Diagnosis not present

## 2018-08-25 DIAGNOSIS — E785 Hyperlipidemia, unspecified: Secondary | ICD-10-CM | POA: Diagnosis not present

## 2018-08-25 DIAGNOSIS — M19019 Primary osteoarthritis, unspecified shoulder: Secondary | ICD-10-CM | POA: Diagnosis not present

## 2018-08-25 DIAGNOSIS — E039 Hypothyroidism, unspecified: Secondary | ICD-10-CM | POA: Diagnosis not present

## 2018-08-25 DIAGNOSIS — K219 Gastro-esophageal reflux disease without esophagitis: Secondary | ICD-10-CM | POA: Diagnosis not present

## 2018-08-25 MED FILL — MELOXICAM 7.5 MG TABLET: 7.5 | 30 days supply | Qty: 30 | Fill #0

## 2018-08-25 MED FILL — SM ACID REDUCER 10 MG TABS: 10 | 30 days supply | Qty: 60 | Fill #0

## 2018-08-28 DIAGNOSIS — E785 Hyperlipidemia, unspecified: Secondary | ICD-10-CM | POA: Diagnosis not present

## 2018-09-10 MED FILL — EZETIMIBE 10 MG TABS: 10 | 90 days supply | Qty: 90 | Fill #2

## 2018-09-10 MED FILL — SIMVASTATIN 20 MG TABLET: 20 | 90 days supply | Qty: 90 | Fill #2

## 2018-10-27 MED FILL — AMLODIPINE 2.5 MG TABLET: 2.5 | 90 days supply | Qty: 90 | Fill #0

## 2018-10-27 MED FILL — LEVOTHYROXINE 88 MCG TABLET: 88 | 90 days supply | Qty: 90 | Fill #0

## 2018-12-31 MED FILL — SIMVASTATIN 20 MG TABLET: 20 | 90 days supply | Qty: 90 | Fill #3

## 2018-12-31 MED FILL — EZETIMIBE 10 MG TABS: 10 | 90 days supply | Qty: 90 | Fill #3

## 2019-01-19 ENCOUNTER — Other Ambulatory Visit: Payer: Self-pay | Admitting: Internal Medicine

## 2019-01-19 DIAGNOSIS — Z1231 Encounter for screening mammogram for malignant neoplasm of breast: Secondary | ICD-10-CM

## 2019-02-01 MED FILL — LEVOTHYROXINE 88 MCG TABLET: 88 | 90 days supply | Qty: 90 | Fill #0

## 2019-02-01 MED FILL — AMLODIPINE 2.5 MG TABLET: 2.5 | 90 days supply | Qty: 90 | Fill #0

## 2019-02-25 DIAGNOSIS — Z0000 Encounter for general adult medical examination without abnormal findings: Secondary | ICD-10-CM | POA: Diagnosis not present

## 2019-02-25 DIAGNOSIS — K219 Gastro-esophageal reflux disease without esophagitis: Secondary | ICD-10-CM | POA: Diagnosis not present

## 2019-02-25 DIAGNOSIS — E785 Hyperlipidemia, unspecified: Secondary | ICD-10-CM | POA: Diagnosis not present

## 2019-02-25 DIAGNOSIS — M19041 Primary osteoarthritis, right hand: Secondary | ICD-10-CM | POA: Diagnosis not present

## 2019-02-25 DIAGNOSIS — R7303 Prediabetes: Secondary | ICD-10-CM | POA: Diagnosis not present

## 2019-02-25 DIAGNOSIS — I10 Essential (primary) hypertension: Secondary | ICD-10-CM | POA: Diagnosis not present

## 2019-02-25 DIAGNOSIS — Z1211 Encounter for screening for malignant neoplasm of colon: Secondary | ICD-10-CM | POA: Diagnosis not present

## 2019-02-25 DIAGNOSIS — E039 Hypothyroidism, unspecified: Secondary | ICD-10-CM | POA: Diagnosis not present

## 2019-02-25 DIAGNOSIS — Z1239 Encounter for other screening for malignant neoplasm of breast: Secondary | ICD-10-CM | POA: Diagnosis not present

## 2019-02-25 MED FILL — MELOXICAM 7.5 MG TABLET: 7.5 | 30 days supply | Qty: 30 | Fill #0

## 2019-04-16 ENCOUNTER — Other Ambulatory Visit: Payer: Self-pay

## 2019-04-16 ENCOUNTER — Ambulatory Visit
Admission: RE | Admit: 2019-04-16 | Discharge: 2019-04-16 | Disposition: A | Discharge status: Home / Self Care | Payer: 59 | Source: Ambulatory Visit | Attending: Internal Medicine | Admitting: Internal Medicine

## 2019-04-16 DIAGNOSIS — Z1231 Encounter for screening mammogram for malignant neoplasm of breast: Secondary | ICD-10-CM

## 2019-05-03 MED FILL — LEVOTHYROXINE 88 MCG TABLET: 88 | 90 days supply | Qty: 90 | Fill #0

## 2019-05-03 MED FILL — VALACYCLOVIR HCL 500 MG TAB: 500 | 5 days supply | Qty: 30 | Fill #0

## 2019-05-03 MED FILL — SIMVASTATIN 20 MG TABLET: 20 | 90 days supply | Qty: 90 | Fill #0

## 2019-05-03 MED FILL — AMLODIPINE 2.5 MG TABLET: 2.5 | 90 days supply | Qty: 90 | Fill #0

## 2019-05-03 MED FILL — MELOXICAM 7.5 MG TABLET: 7.5 | 30 days supply | Qty: 30 | Fill #0

## 2019-05-03 MED FILL — EZETIMIBE 10 MG TABS: 10 | 90 days supply | Qty: 90 | Fill #0

## 2019-06-21 ENCOUNTER — Other Ambulatory Visit: Payer: Self-pay | Admitting: Internal Medicine

## 2019-06-21 MED ORDER — AMOXICILLIN-POT CLAVULANATE 875-125 MG PO TABS: 1.0000 | ORAL_TABLET | Freq: Two times a day (BID) | ORAL | 0 refills | Status: AC

## 2019-06-21 MED FILL — AMOX-CLAV 875-125 MG TABLET: 875-125 | 7 days supply | Qty: 14 | Fill #0

## 2019-06-21 NOTE — Progress Notes (Signed)
Patient reports 3 day history of progressive nasal congestion, maxillary tenderness. Also has productive cough with yellow-green sputum and subjective low-grade fevers. Similar to sinus infections in the past. She usually does well with Augmentin. Will send in 7 day prescription.

## 2019-08-02 MED FILL — AMLODIPINE 2.5 MG TABLET: 2.5 | 90 days supply | Qty: 90 | Fill #1

## 2019-08-02 MED FILL — LEVOTHYROXINE 88 MCG TABLET: 88 | 90 days supply | Qty: 90 | Fill #1

## 2019-08-27 DIAGNOSIS — Z6830 Body mass index (BMI) 30.0-30.9, adult: Secondary | ICD-10-CM | POA: Diagnosis not present

## 2019-08-27 DIAGNOSIS — E785 Hyperlipidemia, unspecified: Secondary | ICD-10-CM | POA: Diagnosis not present

## 2019-08-27 DIAGNOSIS — R7303 Prediabetes: Secondary | ICD-10-CM | POA: Diagnosis not present

## 2019-08-27 DIAGNOSIS — I10 Essential (primary) hypertension: Secondary | ICD-10-CM | POA: Diagnosis not present

## 2019-11-05 MED FILL — LEVOTHYROXINE 88 MCG TABLET: 88 | 90 days supply | Qty: 90 | Fill #2

## 2019-11-05 MED FILL — AMLODIPINE 2.5 MG TABLET: 2.5 | 90 days supply | Qty: 90 | Fill #2

## 2019-12-13 MED FILL — SIMVASTATIN 20 MG TABLET: 20 | 90 days supply | Qty: 90 | Fill #2

## 2019-12-13 MED FILL — EZETIMIBE 10 MG TABS: 10 | 90 days supply | Qty: 90 | Fill #2

## 2020-02-02 ENCOUNTER — Other Ambulatory Visit: Payer: Self-pay | Admitting: Internal Medicine

## 2020-02-02 DIAGNOSIS — Z1231 Encounter for screening mammogram for malignant neoplasm of breast: Secondary | ICD-10-CM

## 2020-02-07 MED FILL — LEVOTHYROXINE 88 MCG TABLET: 88 | 90 days supply | Qty: 90 | Fill #3

## 2020-02-07 MED FILL — AMLODIPINE 2.5 MG TABLET: 2.5 | 90 days supply | Qty: 90 | Fill #3

## 2020-02-28 ENCOUNTER — Other Ambulatory Visit (HOSPITAL_COMMUNITY): Payer: Self-pay | Admitting: Internal Medicine

## 2020-02-28 DIAGNOSIS — Z1231 Encounter for screening mammogram for malignant neoplasm of breast: Secondary | ICD-10-CM | POA: Diagnosis not present

## 2020-02-28 DIAGNOSIS — E785 Hyperlipidemia, unspecified: Secondary | ICD-10-CM | POA: Diagnosis not present

## 2020-02-28 DIAGNOSIS — Z9889 Other specified postprocedural states: Secondary | ICD-10-CM | POA: Diagnosis not present

## 2020-02-28 DIAGNOSIS — M19019 Primary osteoarthritis, unspecified shoulder: Secondary | ICD-10-CM | POA: Diagnosis not present

## 2020-02-28 DIAGNOSIS — I10 Essential (primary) hypertension: Secondary | ICD-10-CM | POA: Diagnosis not present

## 2020-02-28 DIAGNOSIS — Z1211 Encounter for screening for malignant neoplasm of colon: Secondary | ICD-10-CM | POA: Diagnosis not present

## 2020-02-28 DIAGNOSIS — J301 Allergic rhinitis due to pollen: Secondary | ICD-10-CM | POA: Diagnosis not present

## 2020-02-28 DIAGNOSIS — Z0000 Encounter for general adult medical examination without abnormal findings: Secondary | ICD-10-CM | POA: Diagnosis not present

## 2020-02-28 DIAGNOSIS — E039 Hypothyroidism, unspecified: Secondary | ICD-10-CM | POA: Diagnosis not present

## 2020-02-28 MED FILL — FLUTICASONE PROP 50 MCG SPR: 50 | 60 days supply | Qty: 16 | Fill #0

## 2020-02-28 MED FILL — EZETIMIBE 10 MG TABS: 10 | 90 days supply | Qty: 90 | Fill #0

## 2020-02-28 MED FILL — SIMVASTATIN 20 MG TABLET: 20 | 90 days supply | Qty: 90 | Fill #0

## 2020-03-14 ENCOUNTER — Ambulatory Visit: Payer: 59 | Attending: Internal Medicine

## 2020-03-14 DIAGNOSIS — Z23 Encounter for immunization: Secondary | ICD-10-CM

## 2020-03-14 NOTE — Progress Notes (Signed)
   Covid-19 Vaccination Clinic  Name:  Jenna Fowler    MRN: [id number] DOB: [birth year]  03/14/2020  [name] was observed post Covid-19 immunization for 15 minutes without incident. She was provided with Vaccine Information Sheet and instruction to access the V-Safe system.   [name] was instructed to call 911 with any severe reactions post vaccine: Marland Kitchen Difficulty breathing  . Swelling of face and throat  . A fast heartbeat  . A bad rash all over body  . Dizziness and weakness   Immunizations Administered    Name Date Dose VIS Date Route   Pfizer COVID-19 Vaccine 03/14/2020  3:43 PM 0.3 mL 01/05/2020 Intramuscular   Manufacturer: [employer], [employer]   Lot: [id number]   NDC: [address]-8

## 2020-03-15 ENCOUNTER — Other Ambulatory Visit (HOSPITAL_COMMUNITY): Payer: Self-pay | Admitting: Internal Medicine

## 2020-03-15 MED FILL — LEVOTHYROXINE 100 MCG TABLE: 100 | 30 days supply | Qty: 30 | Fill #0

## 2020-04-11 MED FILL — LEVOTHYROXINE 100 MCG TABLE: 100 | 60 days supply | Qty: 60 | Fill #1

## 2020-04-17 ENCOUNTER — Ambulatory Visit: Payer: 59

## 2020-05-05 ENCOUNTER — Ambulatory Visit
Admission: RE | Admit: 2020-05-05 | Discharge: 2020-05-05 | Disposition: A | Discharge status: Home / Self Care | Payer: 59 | Source: Ambulatory Visit | Attending: Internal Medicine | Admitting: Internal Medicine

## 2020-05-05 ENCOUNTER — Other Ambulatory Visit: Payer: Self-pay

## 2020-05-05 DIAGNOSIS — Z1231 Encounter for screening mammogram for malignant neoplasm of breast: Secondary | ICD-10-CM

## 2020-05-10 MED FILL — AMLODIPINE 2.5 MG TABLET: 2.5 | 90 days supply | Qty: 90 | Fill #0

## 2020-05-23 ENCOUNTER — Encounter: Payer: Self-pay | Admitting: Gastroenterology

## 2020-05-23 ENCOUNTER — Ambulatory Visit (AMBULATORY_SURGERY_CENTER): Payer: 59

## 2020-05-23 ENCOUNTER — Other Ambulatory Visit: Payer: Self-pay | Admitting: Gastroenterology

## 2020-05-23 ENCOUNTER — Other Ambulatory Visit: Payer: Self-pay

## 2020-05-23 VITALS — Ht 67.0 in | Wt 194.0 lb

## 2020-05-23 DIAGNOSIS — Z86010 Personal history of colonic polyps: Secondary | ICD-10-CM

## 2020-05-23 DIAGNOSIS — Z1211 Encounter for screening for malignant neoplasm of colon: Secondary | ICD-10-CM

## 2020-05-23 MED ORDER — NA SULFATE-K SULFATE-MG SULF 17.5-3.13-1.6 GM/177ML PO SOLN: 1.0000 | Freq: Once | ORAL | 0 refills | Status: DC

## 2020-05-23 MED FILL — SUPREP BOWEL PREP KIT: 17.5-3.13-1 | 1 days supply | Qty: 354 | Fill #0

## 2020-05-23 NOTE — Progress Notes (Signed)
Patient's pre-visit was done today over the phone with the patient due to COVID-19 pandemic.   Name,DOB and address verified. Insurance verified. \  Patient denies any allergies to Eggs and Soy.  Patient denies any problems with anesthesia/sedation. Patient denies taking diet pills or blood thinners.  Patient denies constipation  Packet of Prep instructions mailed to patient including a copy of a consent form and pre-procedure patient acknowledgement form (with envelope to mail back to us)-pt is aware.    Patient understands to call us back with any questions or concerns. The patient is COVID-19 fully vaccinated, per patient. Patient is aware of our care-partner policy and ALPFX-90 safety protocol. EMMI education assigned to the patient for the procedure, sent to [facility].   Suprep instructions reviewed and sent to pt via mail.

## 2020-06-06 ENCOUNTER — Encounter: Payer: Self-pay | Admitting: Gastroenterology

## 2020-06-06 ENCOUNTER — Ambulatory Visit (AMBULATORY_SURGERY_CENTER): Payer: 59 | Admitting: Gastroenterology

## 2020-06-06 ENCOUNTER — Other Ambulatory Visit: Payer: Self-pay

## 2020-06-06 VITALS — BP 110/59 | HR 45 | Temp 97.7°F | Resp 12 | Ht 67.0 in | Wt 194.0 lb

## 2020-06-06 DIAGNOSIS — D122 Benign neoplasm of ascending colon: Secondary | ICD-10-CM | POA: Diagnosis not present

## 2020-06-06 DIAGNOSIS — D123 Benign neoplasm of transverse colon: Secondary | ICD-10-CM | POA: Diagnosis not present

## 2020-06-06 DIAGNOSIS — Z1211 Encounter for screening for malignant neoplasm of colon: Secondary | ICD-10-CM | POA: Diagnosis not present

## 2020-06-06 DIAGNOSIS — Z86010 Personal history of colonic polyps: Secondary | ICD-10-CM

## 2020-06-06 DIAGNOSIS — K635 Polyp of colon: Secondary | ICD-10-CM

## 2020-06-06 MED ORDER — SODIUM CHLORIDE 0.9 % IV SOLN: 500.0000 mL | Freq: Once | INTRAVENOUS | Status: DC

## 2020-06-06 NOTE — Op Note (Signed)
[facility] Patient Name: Jenna Fowler Procedure Date: 06/06/2020 7:30 AM MRN: [id number] Endoscopist: Mauri Pole , MD Age: 69 Referring MD:  Date of Birth: [birth year] Gender: Female Account #: [id number] Procedure:                Colonoscopy Indications:              High risk colon cancer surveillance: Personal                            history of colonic polyps, High risk colon cancer                            surveillance: Personal history of adenoma (10 mm or                            greater in size) Medicines:                Monitored Anesthesia Care Procedure:                Pre-Anesthesia Assessment:                           - Prior to the procedure, a History and Physical                            was performed, and patient medications and                            allergies were reviewed. The patient's tolerance of                            previous anesthesia was also reviewed. The risks                            and benefits of the procedure and the sedation                            options and risks were discussed with the patient.                            All questions were answered, and informed consent                            was obtained. ASA Grade Assessment: II - A patient                            with mild systemic disease. After reviewing the                            risks and benefits, the patient was deemed in                            satisfactory condition to undergo the procedure.  After obtaining informed consent, the colonoscope                            was passed under direct vision. Throughout the                            procedure, the patient's blood pressure, pulse, and                            oxygen saturations were monitored continuously. The                            Olympus PCF-H190DL (#[id number]) Colonoscope was                            introduced through the anus and  advanced to the the                            terminal ileum, with identification of the                            appendiceal orifice and IC valve. The colonoscopy                            was performed without difficulty. The patient                            tolerated the procedure well. The quality of the                            bowel preparation was excellent. The ileocecal                            valve, appendiceal orifice, and rectum were                            photographed. Scope In: 8:17:17 AM Scope Out: 8:28:22 AM Scope Withdrawal Time: 0 hours 7 minutes 47 seconds  Total Procedure Duration: 0 hours 11 minutes 5 seconds  Findings:                 The perianal and digital rectal examinations were                            normal.                           A 3 mm polyp was found in the transverse colon. The                            polyp was sessile. The polyp was removed with a                            cold snare. Resection was complete, but the polyp  tissue was not retrieved.                           Scattered small and large-mouthed diverticula were                            found in the sigmoid colon, descending colon,                            transverse colon and ascending colon.                           Non-bleeding external and internal hemorrhoids were                            found during retroflexion. The hemorrhoids were                            medium-sized. Complications:            No immediate complications. Estimated Blood Loss:     Estimated blood loss was minimal. Impression:               - One 3 mm polyp in the transverse colon, removed                            with a cold snare. Complete resection. Polyp tissue                            not retrieved.                           - Diverticulosis in the sigmoid colon, in the                            descending colon, in the transverse colon and in                             the ascending colon.                           - Non-bleeding external and internal hemorrhoids. Recommendation:           - Patient has a contact number available for                            emergencies. The signs and symptoms of potential                            delayed complications were discussed with the                            patient. Return to normal activities tomorrow.                            Written discharge instructions were provided to the  patient.                           - Resume previous diet.                           - Continue present medications.                           - Repeat colonoscopy in 5 years for surveillance. Mauri Pole, MD 06/06/2020 8:34:17 AM This report has been signed electronically.

## 2020-06-06 NOTE — Progress Notes (Signed)
Pt's states no medical or surgical changes since previsit or office visit. 

## 2020-06-06 NOTE — Patient Instructions (Signed)
Handout on polyp and diverticulosis given Resume previous diet Continue present medications Repeat in 5 years  YOU HAD AN ENDOSCOPIC PROCEDURE TODAY AT [facility]:   Refer to the procedure report that was given to you for any specific questions about what was found during the examination.  If the procedure report does not answer your questions, please call your gastroenterologist to clarify.  If you requested that your care partner not be given the details of your procedure findings, then the procedure report has been included in a sealed envelope for you to review at your convenience later.  YOU SHOULD EXPECT: Some feelings of bloating in the abdomen. Passage of more gas than usual.  Walking can help get rid of the air that was put into your GI tract during the procedure and reduce the bloating. If you had a lower endoscopy (such as a colonoscopy or flexible sigmoidoscopy) you may notice spotting of blood in your stool or on the toilet paper. If you underwent a bowel prep for your procedure, you may not have a normal bowel movement for a few days.  Please Note:  You might notice some irritation and congestion in your nose or some drainage.  This is from the oxygen used during your procedure.  There is no need for concern and it should clear up in a day or so.  SYMPTOMS TO REPORT IMMEDIATELY:   Following lower endoscopy (colonoscopy or flexible sigmoidoscopy):  Excessive amounts of blood in the stool  Significant tenderness or worsening of abdominal pains  Swelling of the abdomen that is new, acute  Fever of 100F or higher  For urgent or emergent issues, a gastroenterologist can be reached at any hour by calling [phone]. Do not use MyChart messaging for urgent concerns.   DIET:  We do recommend a small meal at first, but then you may proceed to your regular diet.  Drink plenty of fluids but you should avoid alcoholic beverages for 24 hours.  ACTIVITY:  You should  plan to take it easy for the rest of today and you should NOT DRIVE or use heavy machinery until tomorrow (because of the sedation medicines used during the test).    FOLLOW UP: Our staff will call the number listed on your records 48-72 hours following your procedure to check on you and address any questions or concerns that you may have regarding the information given to you following your procedure. If we do not reach you, we will leave a message.  We will attempt to reach you two times.  During this call, we will ask if you have developed any symptoms of COVID 19. If you develop any symptoms (ie: fever, flu-like symptoms, shortness of breath, cough etc.) before then, please call [phone].  If you test positive for Covid 19 in the 2 weeks post procedure, please call and report this information to [place].    If any biopsies were taken you will be contacted by phone or by letter within the next 1-3 weeks.  Please call us at [phone] if you have not heard about the biopsies in 3 weeks.   SIGNATURES/CONFIDENTIALITY: You and/or your care partner have signed paperwork which will be entered into your electronic medical record.  These signatures attest to the fact that that the information above on your After Visit Summary has been reviewed and is understood.  Full responsibility of the confidentiality of this discharge information lies with you and/or your care-partner.

## 2020-06-06 NOTE — Progress Notes (Signed)
A and O x3. Report to RN. Tolerated MAC anesthesia well.

## 2020-06-06 NOTE — Progress Notes (Signed)
C.W. vital signs. 

## 2020-06-08 ENCOUNTER — Telehealth: Payer: Self-pay

## 2020-06-08 NOTE — Telephone Encounter (Signed)
LVM

## 2020-06-13 DIAGNOSIS — H52223 Regular astigmatism, bilateral: Secondary | ICD-10-CM | POA: Diagnosis not present

## 2020-06-13 DIAGNOSIS — H5213 Myopia, bilateral: Secondary | ICD-10-CM | POA: Diagnosis not present

## 2020-06-13 DIAGNOSIS — H524 Presbyopia: Secondary | ICD-10-CM | POA: Diagnosis not present

## 2020-06-14 ENCOUNTER — Other Ambulatory Visit (HOSPITAL_COMMUNITY): Payer: Self-pay | Admitting: Internal Medicine

## 2020-06-14 DIAGNOSIS — D123 Benign neoplasm of transverse colon: Secondary | ICD-10-CM | POA: Diagnosis not present

## 2020-06-14 DIAGNOSIS — D1611 Benign neoplasm of short bones of right upper limb: Secondary | ICD-10-CM | POA: Diagnosis not present

## 2020-06-14 DIAGNOSIS — E039 Hypothyroidism, unspecified: Secondary | ICD-10-CM | POA: Diagnosis not present

## 2020-06-14 DIAGNOSIS — E669 Obesity, unspecified: Secondary | ICD-10-CM | POA: Diagnosis not present

## 2020-06-14 DIAGNOSIS — Z23 Encounter for immunization: Secondary | ICD-10-CM | POA: Diagnosis not present

## 2020-06-14 MED FILL — MELOXICAM 7.5 MG TABLET: 7.5 | 30 days supply | Qty: 30 | Fill #0

## 2020-06-16 MED FILL — LEVOTHYROXINE 100 MCG TABLE: 100 | 60 days supply | Qty: 60 | Fill #2

## 2020-08-09 ENCOUNTER — Other Ambulatory Visit (HOSPITAL_COMMUNITY): Payer: Self-pay

## 2020-08-09 MED FILL — Amlodipine Besylate Tab 2.5 MG (Base Equivalent): ORAL | 90 days supply | Qty: 90 | Fill #0 | Status: AC

## 2020-08-16 ENCOUNTER — Other Ambulatory Visit (HOSPITAL_COMMUNITY): Payer: Self-pay

## 2020-08-16 MED FILL — Simvastatin Tab 20 MG: ORAL | 90 days supply | Qty: 90 | Fill #0 | Status: AC

## 2020-08-16 MED FILL — Ezetimibe Tab 10 MG: ORAL | 90 days supply | Qty: 90 | Fill #0 | Status: AC

## 2020-08-20 MED FILL — Levothyroxine Sodium Tab 100 MCG: ORAL | 30 days supply | Qty: 30 | Fill #0 | Status: AC

## 2020-08-21 ENCOUNTER — Other Ambulatory Visit (HOSPITAL_COMMUNITY): Payer: Self-pay

## 2020-08-29 DIAGNOSIS — Z124 Encounter for screening for malignant neoplasm of cervix: Secondary | ICD-10-CM | POA: Diagnosis not present

## 2020-08-29 DIAGNOSIS — E039 Hypothyroidism, unspecified: Secondary | ICD-10-CM | POA: Diagnosis not present

## 2020-08-29 DIAGNOSIS — Z23 Encounter for immunization: Secondary | ICD-10-CM | POA: Diagnosis not present

## 2020-09-19 ENCOUNTER — Other Ambulatory Visit (HOSPITAL_COMMUNITY): Payer: Self-pay

## 2020-09-19 MED FILL — Levothyroxine Sodium Tab 100 MCG: ORAL | 90 days supply | Qty: 90 | Fill #0 | Status: AC

## 2020-11-09 ENCOUNTER — Other Ambulatory Visit (HOSPITAL_COMMUNITY): Payer: Self-pay

## 2020-11-09 MED FILL — Simvastatin Tab 20 MG: ORAL | 90 days supply | Qty: 90 | Fill #1 | Status: AC

## 2020-11-09 MED FILL — Amlodipine Besylate Tab 2.5 MG (Base Equivalent): ORAL | 90 days supply | Qty: 90 | Fill #1 | Status: AC

## 2020-11-09 MED FILL — Ezetimibe Tab 10 MG: ORAL | 90 days supply | Qty: 90 | Fill #1 | Status: AC

## 2020-12-20 ENCOUNTER — Other Ambulatory Visit (HOSPITAL_COMMUNITY): Payer: Self-pay

## 2020-12-20 MED FILL — Levothyroxine Sodium Tab 100 MCG: ORAL | 90 days supply | Qty: 90 | Fill #1 | Status: AC

## 2021-02-08 MED FILL — Amlodipine Besylate Tab 2.5 MG (Base Equivalent): ORAL | 90 days supply | Qty: 90 | Fill #2 | Status: AC

## 2021-02-09 ENCOUNTER — Other Ambulatory Visit (HOSPITAL_COMMUNITY): Payer: Self-pay

## 2021-02-27 ENCOUNTER — Other Ambulatory Visit (HOSPITAL_COMMUNITY): Payer: Self-pay

## 2021-02-27 ENCOUNTER — Other Ambulatory Visit: Payer: Self-pay | Admitting: Internal Medicine

## 2021-02-27 DIAGNOSIS — K219 Gastro-esophageal reflux disease without esophagitis: Secondary | ICD-10-CM | POA: Diagnosis not present

## 2021-02-27 DIAGNOSIS — I10 Essential (primary) hypertension: Secondary | ICD-10-CM | POA: Diagnosis not present

## 2021-02-27 DIAGNOSIS — E039 Hypothyroidism, unspecified: Secondary | ICD-10-CM | POA: Diagnosis not present

## 2021-02-27 DIAGNOSIS — J301 Allergic rhinitis due to pollen: Secondary | ICD-10-CM | POA: Diagnosis not present

## 2021-02-27 DIAGNOSIS — E785 Hyperlipidemia, unspecified: Secondary | ICD-10-CM | POA: Diagnosis not present

## 2021-02-27 DIAGNOSIS — Z1231 Encounter for screening mammogram for malignant neoplasm of breast: Secondary | ICD-10-CM

## 2021-02-27 DIAGNOSIS — Z6830 Body mass index (BMI) 30.0-30.9, adult: Secondary | ICD-10-CM | POA: Diagnosis not present

## 2021-02-27 DIAGNOSIS — Z0000 Encounter for general adult medical examination without abnormal findings: Secondary | ICD-10-CM | POA: Diagnosis not present

## 2021-02-27 DIAGNOSIS — E669 Obesity, unspecified: Secondary | ICD-10-CM | POA: Diagnosis not present

## 2021-02-27 MED ORDER — AMLODIPINE BESYLATE 2.5 MG PO TABS
2.5000 mg | ORAL_TABLET | Freq: Every day | ORAL | 3 refills | Status: AC
  Filled 2021-02-27 – 2021-05-10 (×2): qty 90, 90d supply, fill #0
  Filled 2021-08-08: qty 90, 90d supply, fill #1
  Filled 2021-11-08: qty 90, 90d supply, fill #2
  Filled 2022-02-05: qty 90, 90d supply, fill #3

## 2021-02-27 MED ORDER — SIMVASTATIN 20 MG PO TABS
20.0000 mg | ORAL_TABLET | Freq: Every evening | ORAL | 3 refills | Status: DC
  Filled 2021-02-27: qty 90, 90d supply, fill #0
  Filled 2021-06-13: qty 90, 90d supply, fill #1

## 2021-02-27 MED ORDER — LEVOTHYROXINE SODIUM 100 MCG PO TABS
100.0000 ug | ORAL_TABLET | Freq: Every morning | ORAL | 4 refills | Status: DC
  Filled 2021-02-27: qty 90, 90d supply, fill #0
  Filled 2021-06-13: qty 90, 90d supply, fill #1
  Filled 2021-09-12: qty 90, 90d supply, fill #2
  Filled 2021-12-16: qty 90, 90d supply, fill #3

## 2021-02-27 MED ORDER — MELOXICAM 7.5 MG PO TABS
7.5000 mg | ORAL_TABLET | Freq: Every day | ORAL | 0 refills | Status: AC | PRN
  Filled 2021-02-27: qty 30, 30d supply, fill #0

## 2021-02-27 MED ORDER — EZETIMIBE 10 MG PO TABS
10.0000 mg | ORAL_TABLET | Freq: Every day | ORAL | 3 refills | Status: DC
  Filled 2021-02-27: qty 90, 90d supply, fill #0
  Filled 2021-06-13: qty 90, 90d supply, fill #1

## 2021-02-28 ENCOUNTER — Other Ambulatory Visit (HOSPITAL_COMMUNITY): Payer: Self-pay

## 2021-03-02 ENCOUNTER — Other Ambulatory Visit (HOSPITAL_COMMUNITY): Payer: Self-pay

## 2021-03-07 ENCOUNTER — Other Ambulatory Visit (HOSPITAL_BASED_OUTPATIENT_CLINIC_OR_DEPARTMENT_OTHER): Payer: Self-pay

## 2021-03-07 ENCOUNTER — Other Ambulatory Visit: Payer: Self-pay

## 2021-03-07 ENCOUNTER — Ambulatory Visit: Payer: 59 | Attending: Internal Medicine

## 2021-03-07 DIAGNOSIS — Z23 Encounter for immunization: Secondary | ICD-10-CM

## 2021-03-07 MED ORDER — PFIZER COVID-19 VAC BIVALENT 30 MCG/0.3ML IM SUSP
INTRAMUSCULAR | 0 refills | Status: AC
  Filled 2021-03-07: qty 0.3, 1d supply, fill #0

## 2021-03-07 NOTE — Progress Notes (Signed)
° °  Covid-19 Vaccination Clinic  Name:  KEONNA RAETHER    MRN: [id number] DOB: September 22, 1950  03/07/2021  Ms. Leandro was observed post Covid-19 immunization for 15 minutes without incident. She was provided with Vaccine Information Sheet and instruction to access the V-Safe system.   Ms. Morocho was instructed to call 911 with any severe reactions post vaccine: Difficulty breathing  Swelling of face and throat  A fast heartbeat  A bad rash all over body  Dizziness and weakness   Immunizations Administered     Name Date Dose VIS Date Route   Pfizer Covid-19 Vaccine Bivalent Booster 03/07/2021  3:23 PM 0.3 mL 11/15/2020 Intramuscular   Manufacturer: [place]   Lot: [id number]   [place]: [phone]

## 2021-05-07 ENCOUNTER — Ambulatory Visit
Admission: RE | Admit: 2021-05-07 | Discharge: 2021-05-07 | Disposition: A | Discharge status: Home / Self Care | Payer: 59 | Source: Ambulatory Visit | Attending: Internal Medicine | Admitting: Internal Medicine

## 2021-05-07 DIAGNOSIS — Z1231 Encounter for screening mammogram for malignant neoplasm of breast: Secondary | ICD-10-CM | POA: Diagnosis not present

## 2021-05-10 ENCOUNTER — Other Ambulatory Visit (HOSPITAL_COMMUNITY): Payer: Self-pay

## 2021-06-13 ENCOUNTER — Other Ambulatory Visit (HOSPITAL_COMMUNITY): Payer: Self-pay

## 2021-06-29 ENCOUNTER — Other Ambulatory Visit (HOSPITAL_COMMUNITY): Payer: Self-pay

## 2021-08-09 ENCOUNTER — Other Ambulatory Visit (HOSPITAL_COMMUNITY): Payer: Self-pay

## 2021-08-28 ENCOUNTER — Other Ambulatory Visit (HOSPITAL_COMMUNITY): Payer: Self-pay

## 2021-08-28 DIAGNOSIS — R7303 Prediabetes: Secondary | ICD-10-CM | POA: Diagnosis not present

## 2021-08-28 DIAGNOSIS — E785 Hyperlipidemia, unspecified: Secondary | ICD-10-CM | POA: Diagnosis not present

## 2021-08-28 DIAGNOSIS — I10 Essential (primary) hypertension: Secondary | ICD-10-CM | POA: Diagnosis not present

## 2021-08-28 MED ORDER — SIMVASTATIN 20 MG PO TABS
20.0000 mg | ORAL_TABLET | Freq: Every evening | ORAL | 3 refills | Status: AC
  Filled 2021-08-28: qty 90, 90d supply, fill #0
  Filled 2022-01-07: qty 90, 90d supply, fill #1

## 2021-08-28 MED ORDER — AMLODIPINE BESYLATE 2.5 MG PO TABS
2.5000 mg | ORAL_TABLET | Freq: Every day | ORAL | 3 refills | Status: AC
  Filled 2021-08-28: qty 90, 90d supply, fill #0

## 2021-09-12 ENCOUNTER — Other Ambulatory Visit (HOSPITAL_COMMUNITY): Payer: Self-pay

## 2021-09-19 ENCOUNTER — Encounter: Payer: Self-pay | Admitting: Student

## 2021-09-19 ENCOUNTER — Other Ambulatory Visit (HOSPITAL_COMMUNITY): Payer: Self-pay

## 2021-09-19 ENCOUNTER — Ambulatory Visit (INDEPENDENT_AMBULATORY_CARE_PROVIDER_SITE_OTHER): Payer: 59 | Admitting: Student

## 2021-09-19 DIAGNOSIS — R42 Dizziness and giddiness: Secondary | ICD-10-CM | POA: Diagnosis not present

## 2021-09-19 MED ORDER — MECLIZINE HCL 25 MG PO TABS: 25.0000 mg | ORAL_TABLET | Freq: Three times a day (TID) | ORAL | 0 refills | Status: AC | PRN

## 2021-09-19 MED ORDER — ONDANSETRON 4 MG PO TBDP
4.0000 mg | ORAL_TABLET | Freq: Three times a day (TID) | ORAL | 0 refills | Status: DC | PRN
  Filled 2021-09-19: qty 90, 30d supply, fill #0

## 2021-09-19 NOTE — Patient Instructions (Addendum)
Thank you, Jenna Fowler for allowing [place] to provide your care today. Today we discussed .  Vertigo Please take zofran every 6 hours as needed for nausea and meclizine 25 mg (2 tabs) every 8 hours. Please start with bland foods (crackers, water, broth) until able to tolerate more solid foods.     I have ordered the following labs for you:  Lab Orders  No laboratory test(s) ordered today      Referrals ordered today:   Referral Orders  No referral(s) requested today     I have ordered the following medication/changed the following medications:   Stop the following medications: There are no discontinued medications.   Start the following medications: Meds ordered this encounter  Medications   ondansetron (ZOFRAN-ODT) 4 MG disintegrating tablet    Sig: Take 1 tablet (4 mg total) by mouth every 8 (eight) hours as needed for nausea or vomiting.    Dispense:  90 tablet    Refill:  0   meclizine (ANTIVERT) 25 MG tablet    Sig: Take 1 tablet (25 mg total) by mouth 3 (three) times daily as needed for dizziness.    Dispense:  30 tablet    Refill:  0     Follow up:  1 week with [place] or PCP         Should you have any questions or concerns please call the internal medicine clinic at [phone].    Sanjuana Letters, D.O. [place]

## 2021-09-19 NOTE — Progress Notes (Signed)
CC: dizziness, vomiting  HPI:  Jenna Fowler is a 71 y.o. female living with a history stated below and presents today for dizziness and vomiting. Please see problem based assessment and plan for additional details.  Past Medical History:  Diagnosis Date   Allergy    occasionally   Cataract    watching the beggining of one   Costochondritis, acute    of chest wall   GERD (gastroesophageal reflux disease)    Hyperlipidemia    Hypertension    Osteoarthritis    Thyroid disease     Current Outpatient Medications on File Prior to Visit  Medication Sig Dispense Refill   amLODipine (NORVASC) 2.5 MG tablet Take 2.5 mg by mouth daily.     amLODipine (NORVASC) 2.5 MG tablet Take 1 tablet (2.5 mg total) by mouth daily. 90 tablet 3   amLODipine (NORVASC) 2.5 MG tablet Take 1 tablet (2.5 mg total) by mouth daily. 90 tablet 3   aspirin 81 MG tablet Take 81 mg by mouth daily.     BIOTIN PO Take 1,000 mg by mouth daily.     cetirizine (ZYRTEC) 10 MG tablet Take 10 mg by mouth daily.     Chromium Picolinate 800 MCG TABS Take 800 mcg by mouth daily. With cinnamon     CINNAMON PO Take 2,000 mg by mouth daily.     COVID-19 mRNA bivalent vaccine, Pfizer, (PFIZER COVID-19 VAC BIVALENT) injection Inject into the muscle. 0.3 mL 0   Cyanocobalamin (VITAMIN B12) 1000 MCG TBCR 1 tablet     ezetimibe (ZETIA) 10 MG tablet TAKE 1 TABLET BY MOUTH ONCE DAILY 90 tablet 3   ezetimibe (ZETIA) 10 MG tablet Take 1 tablet (10 mg total) by mouth daily. 90 tablet 3   ezetimibe-simvastatin (VYTORIN) 10-20 MG per tablet Take 1 tablet by mouth at bedtime.     famotidine (PEPCID) 10 MG tablet 1 tablet as needed     fluticasone (FLONASE) 50 MCG/ACT nasal spray PLACE 1 SPRAY IN EACH NOSTRIL ONCE A DAY 16 g 3   levothyroxine (SYNTHROID) 100 MCG tablet TAKE 1 TABLET BY MOUTH ONCE DAILY IN THE MORNING ON AN EMPTY STOMACH 90 tablet 3   levothyroxine (SYNTHROID) 100 MCG tablet Take 1 tablet (100 mcg total) by mouth in  the morning on an empty stomach 90 tablet 4   meloxicam (MOBIC) 15 MG tablet Take 1 tablet (15 mg total) by mouth daily. 40 tablet 2   meloxicam (MOBIC) 7.5 MG tablet Take 1 tablet (7.5 mg total) by mouth daily as needed. 30 tablet 0   ranitidine (ZANTAC) 150 MG capsule Take 150 mg by mouth daily.  (Patient not taking: No sig reported)     simvastatin (ZOCOR) 20 MG tablet TAKE 1 TABLET BY MOUTH EVERY EVENING 90 tablet 3   simvastatin (ZOCOR) 20 MG tablet Take 1 tablet (20 mg total) by mouth every evening. 90 tablet 3   simvastatin (ZOCOR) 20 MG tablet Take 1 tablet (20 mg total) by mouth every evening. 90 tablet 3   terbinafine (LAMISIL) 250 MG tablet Take 1 tablet (250 mg total) by mouth daily. (Patient not taking: No sig reported) 90 tablet 0   Vitamin D, Cholecalciferol, 1000 UNITS CAPS Take 1,000 Units by mouth daily.     No current facility-administered medications on file prior to visit.    Family History  Problem Relation Age of Onset   Pancreatic cancer Maternal Aunt    Pancreatic cancer Maternal Grandfather  Colon cancer Neg Hx    Rectal cancer Neg Hx    Stomach cancer Neg Hx    Esophageal cancer Neg Hx    Colon polyps Neg Hx    Breast cancer Neg Hx     Social History   Socioeconomic History   Marital status: Widowed    Spouse name: Not on file   Number of children: Not on file   Years of education: Not on file   Highest education level: Not on file  Occupational History   Not on file  Tobacco Use   Smoking status: Never   Smokeless tobacco: Never  Vaping Use   Vaping Use: Never used  Substance and Sexual Activity   Alcohol use: No    Alcohol/week: 0.0 standard drinks of alcohol   Drug use: No   Sexual activity: Not on file  Other Topics Concern   Not on file  Social History Narrative   Not on file   Social Determinants of Health   Financial Resource Strain: Not on file  Food Insecurity: Not on file  Transportation Needs: Not on file  Physical  Activity: Not on file  Stress: Not on file  Social Connections: Not on file  Intimate Partner Violence: Not on file    Review of Systems: ROS negative except for what is noted on the assessment and plan.  Vitals:   09/19/21 1348  BP: (!) 134/91  Pulse: 75  Temp: 98.7 F (37.1 C)  TempSrc: Oral  SpO2: 100%  Weight: 183 lb (83 kg)  Height: '5\' 7"'$  (1.702 m)    Physical Exam: Constitutional: ill appearing HENT: normocephalic atraumatic, mucous membranes moist. PERRL. No nystagmus at rest. Extra occular motion intact. Eyes: conjunctiva non-erythematous Neck: supple Cardiovascular: regular rate and rhythm, no m/r/g Pulmonary/Chest: normal work of breathing on room air, lungs clear to auscultation bilaterally Abdominal: soft, non-tender, non-distended MSK: normal bulk and tone Neurological: alert & oriented x 3, no facial asymmetry, 5/5 strength in bilateral upper and lower extremities. Finger to nose normal.  Skin: warm and dry Psych: normal mood  Positive Dix-Hallpike maneuver when looking to the left  Assessment & Plan:   Vertigo Assessment: Ms Marin presented to our clinic with one day of dizziness and vomiting. She was in her usual state of health until yesterday evening when she started to feel dizzy. She was unable to sleep through the evening and woke up throughout the night with episodes of vomiting. Her symptoms did not improve this morning. She describes the dizziness as room spinning. She has had similar symptoms in the past, but they resolved quickly on their own or with meclizine. She tried taking meclizine this morning, but vomited 15 minutes after and is uncertain if the pill was digested.   She denies recent illness, fever, chills. Denies sick contacts. Denies any new neurological deficit, no focal weakness. She has a headache since this morning that wraps around the front of her forehead. Denies visual defects or pain in the back of her head.   Physical exam  positive for ill appearing female. No neurological deficit, sensation and strength intact diffusely. Finger to nose normal. Dix Hallpike maneuver with reproducible symptoms and horizontal nystagmus when head turned 45 degrees to the left. With symptoms and positive Dix-Hallpike maneuver primary differential is vertigo. Discussed return precautions of new neurological symptoms or posterior headache or worsening of current symptoms. Does have risk factors for CVA with HTN, HLD, and pre-diabetic, however, clinical picture more consistent with vertigo  and do not believe she needs emergent imaging at this time.  Plan: -meclizine 25 mg q8h, zofran 4 mg disintegrating tab -follow up in 1 week -strict return precautions given  Patient discussed with Dr. Newell Coral, D.O. [facility], PGY-3 Phone: [phone] Date 09/19/2021 Time 4:07 PM

## 2021-09-19 NOTE — Assessment & Plan Note (Addendum)
Assessment: Jenna Fowler presented to our clinic with one day of dizziness and vomiting. She was in her usual state of health until yesterday evening when she started to feel dizzy. She was unable to sleep through the evening and woke up throughout the night with episodes of vomiting. Her symptoms did not improve this morning. She describes the dizziness as room spinning. She has had similar symptoms in the past, but they resolved quickly on their own or with meclizine. She tried taking meclizine this morning, but vomited 15 minutes after and is uncertain if the pill was digested.   She denies recent illness, fever, chills. Denies sick contacts. Denies any new neurological deficit, no focal weakness. She has a headache since this morning that wraps around the front of her forehead. Denies visual defects or pain in the back of her head.   Physical exam positive for ill appearing female. No neurological deficit, sensation and strength intact diffusely. Finger to nose normal. Dix Hallpike maneuver with reproducible symptoms and horizontal nystagmus when head turned 45 degrees to the left. With symptoms and positive Dix-Hallpike maneuver primary differential is vertigo. Discussed return precautions of new neurological symptoms or posterior headache or worsening of current symptoms. Does have risk factors for CVA with HTN, HLD, and pre-diabetic, however, clinical picture more consistent with vertigo and do not believe she needs emergent imaging at this time.  Plan: -meclizine 25 mg q8h, zofran 4 mg disintegrating tab -follow up in 1 week -strict return precautions given

## 2021-09-25 NOTE — Progress Notes (Signed)
Internal Medicine Clinic Attending  Case discussed with Dr. Katsadouros  At the time of the visit.  We reviewed the resident's history and exam and pertinent patient test results.  I agree with the assessment, diagnosis, and plan of care documented in the resident's note.  

## 2021-11-08 ENCOUNTER — Other Ambulatory Visit (HOSPITAL_COMMUNITY): Payer: Self-pay

## 2021-12-17 ENCOUNTER — Other Ambulatory Visit (HOSPITAL_COMMUNITY): Payer: Self-pay

## 2022-01-07 ENCOUNTER — Other Ambulatory Visit (HOSPITAL_COMMUNITY): Payer: Self-pay

## 2022-01-14 ENCOUNTER — Other Ambulatory Visit: Payer: Self-pay | Admitting: Internal Medicine

## 2022-01-14 DIAGNOSIS — Z1231 Encounter for screening mammogram for malignant neoplasm of breast: Secondary | ICD-10-CM

## 2022-02-05 ENCOUNTER — Other Ambulatory Visit (HOSPITAL_COMMUNITY): Payer: Self-pay

## 2022-03-14 ENCOUNTER — Other Ambulatory Visit (HOSPITAL_COMMUNITY): Payer: Self-pay

## 2022-03-15 ENCOUNTER — Other Ambulatory Visit (HOSPITAL_COMMUNITY): Payer: Self-pay

## 2022-03-15 MED ORDER — LEVOTHYROXINE SODIUM 100 MCG PO TABS
100.0000 ug | ORAL_TABLET | Freq: Every morning | ORAL | 4 refills | Status: AC
  Filled 2022-03-15: qty 90, 90d supply, fill #0

## 2022-03-29 ENCOUNTER — Other Ambulatory Visit (HOSPITAL_COMMUNITY): Payer: Self-pay

## 2022-03-29 DIAGNOSIS — J301 Allergic rhinitis due to pollen: Secondary | ICD-10-CM | POA: Diagnosis not present

## 2022-03-29 DIAGNOSIS — I10 Essential (primary) hypertension: Secondary | ICD-10-CM | POA: Diagnosis not present

## 2022-03-29 DIAGNOSIS — Z9889 Other specified postprocedural states: Secondary | ICD-10-CM | POA: Diagnosis not present

## 2022-03-29 DIAGNOSIS — J0110 Acute frontal sinusitis, unspecified: Secondary | ICD-10-CM | POA: Diagnosis not present

## 2022-03-29 DIAGNOSIS — E039 Hypothyroidism, unspecified: Secondary | ICD-10-CM | POA: Diagnosis not present

## 2022-03-29 DIAGNOSIS — M19041 Primary osteoarthritis, right hand: Secondary | ICD-10-CM | POA: Diagnosis not present

## 2022-03-29 DIAGNOSIS — Z0000 Encounter for general adult medical examination without abnormal findings: Secondary | ICD-10-CM | POA: Diagnosis not present

## 2022-03-29 DIAGNOSIS — R7303 Prediabetes: Secondary | ICD-10-CM | POA: Diagnosis not present

## 2022-03-29 DIAGNOSIS — M19019 Primary osteoarthritis, unspecified shoulder: Secondary | ICD-10-CM | POA: Diagnosis not present

## 2022-03-29 DIAGNOSIS — E785 Hyperlipidemia, unspecified: Secondary | ICD-10-CM | POA: Diagnosis not present

## 2022-03-29 MED ORDER — AMLODIPINE BESYLATE 2.5 MG PO TABS
2.5000 mg | ORAL_TABLET | Freq: Every day | ORAL | 4 refills | Status: DC
  Filled 2022-03-29: qty 90, 90d supply, fill #0
  Filled 2022-08-21: qty 90, 90d supply, fill #1
  Filled 2022-11-19: qty 90, 90d supply, fill #2
  Filled 2023-02-17: qty 90, 90d supply, fill #3

## 2022-03-29 MED ORDER — LEVOTHYROXINE SODIUM 100 MCG PO TABS
100.0000 ug | ORAL_TABLET | Freq: Every morning | ORAL | 4 refills | Status: DC
  Filled 2022-03-29 – 2022-06-18 (×2): qty 90, 90d supply, fill #0
  Filled 2022-09-15: qty 90, 90d supply, fill #1
  Filled 2022-12-19: qty 90, 90d supply, fill #2
  Filled 2023-03-17: qty 90, 90d supply, fill #3

## 2022-03-29 MED ORDER — SIMVASTATIN 20 MG PO TABS
20.0000 mg | ORAL_TABLET | Freq: Every evening | ORAL | 4 refills | Status: DC
  Filled 2022-03-29: qty 90, 90d supply, fill #0
  Filled 2022-09-11 (×2): qty 90, 90d supply, fill #1
  Filled 2022-12-19: qty 90, 90d supply, fill #2
  Filled 2023-03-17: qty 90, 90d supply, fill #3

## 2022-03-29 MED ORDER — MECLIZINE HCL 12.5 MG PO TABS: 12.5000 mg | ORAL_TABLET | Freq: Two times a day (BID) | ORAL | 0 refills | Status: AC | PRN

## 2022-05-08 ENCOUNTER — Ambulatory Visit
Admission: RE | Admit: 2022-05-08 | Discharge: 2022-05-08 | Disposition: A | Discharge status: Home / Self Care | Payer: 59 | Source: Ambulatory Visit | Attending: Internal Medicine | Admitting: Internal Medicine

## 2022-05-08 DIAGNOSIS — Z1231 Encounter for screening mammogram for malignant neoplasm of breast: Secondary | ICD-10-CM | POA: Diagnosis not present

## 2022-05-15 IMAGING — MG MM DIGITAL SCREENING BILAT W/ TOMO AND CAD
8 series · 8 of 24 positions shown · non-contrast
Comparison: Previous exam(s).

CLINICAL DATA: Screening.

EXAM:
DIGITAL SCREENING BILATERAL MAMMOGRAM WITH TOMOSYNTHESIS AND CAD
TECHNIQUE: Bilateral screening digital craniocaudal and mediolateral oblique
mammograms were obtained. Bilateral screening digital breast
tomosynthesis was performed. The images were evaluated with
computer-aided detection.

[R CC synth-2D]
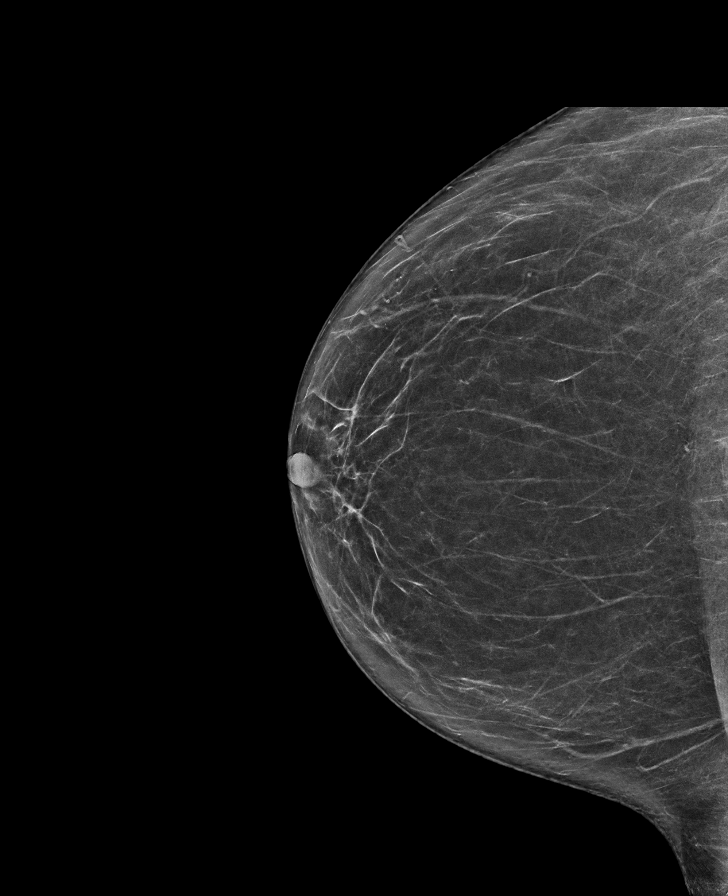

[L CC synth-2D]
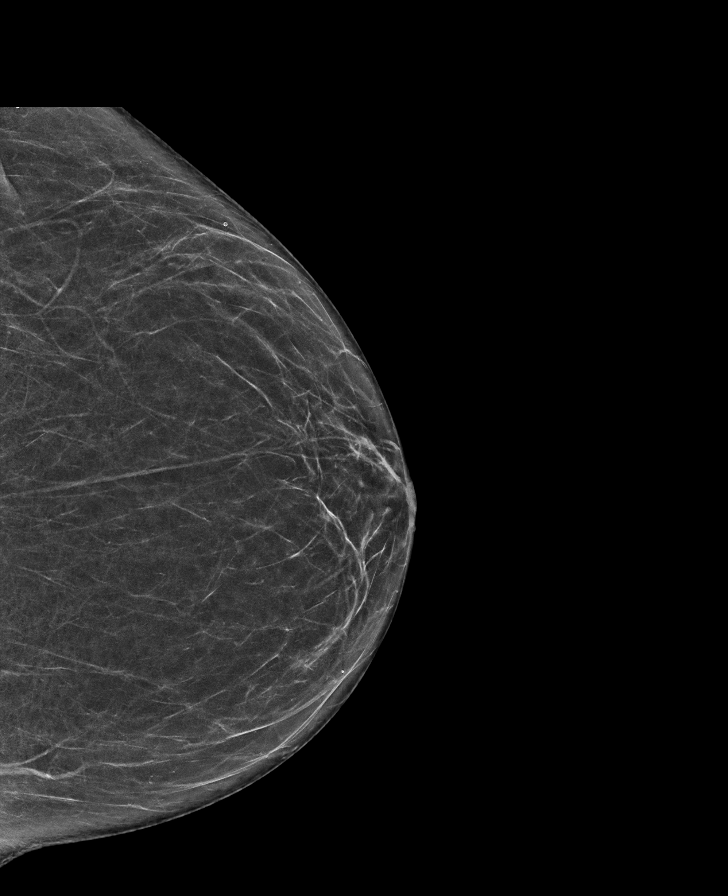

[R MLO synth-2D]
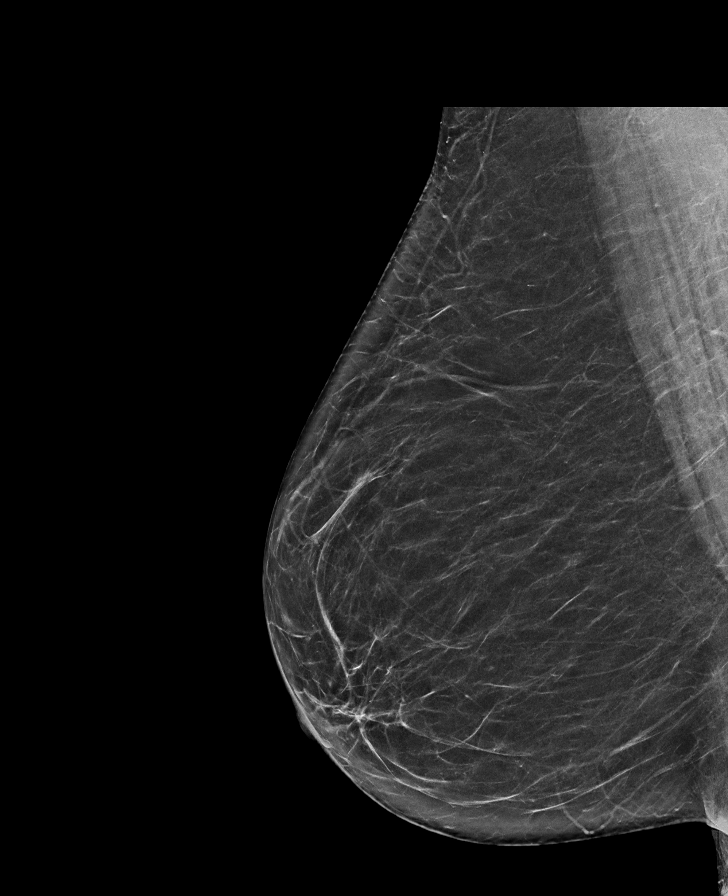

[L MLO synth-2D]
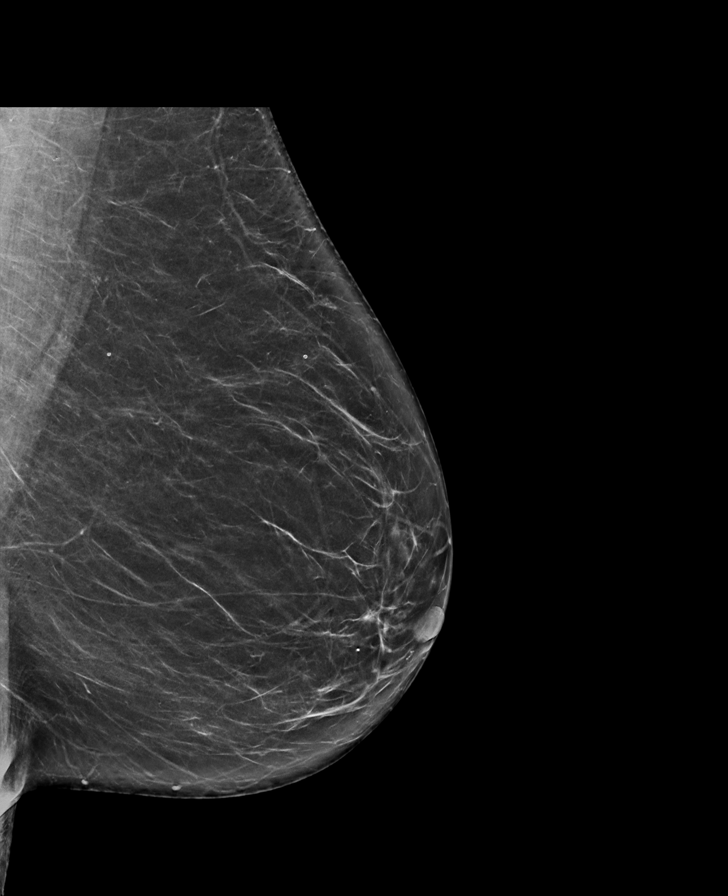

[R CC tomo · tomo slice 37/72.0]
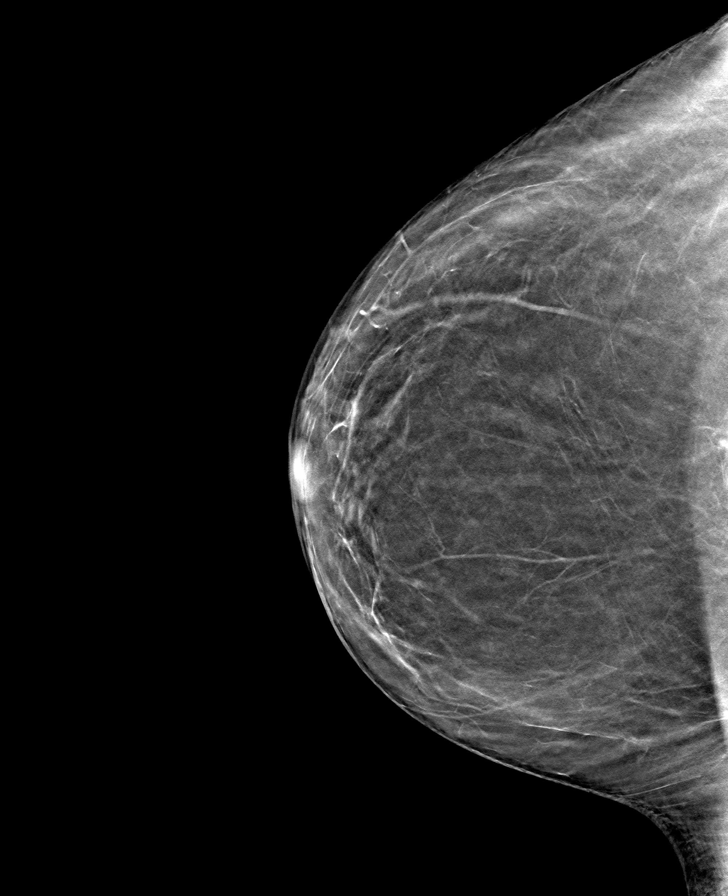

[L MLO tomo · tomo slice 39/77.0]
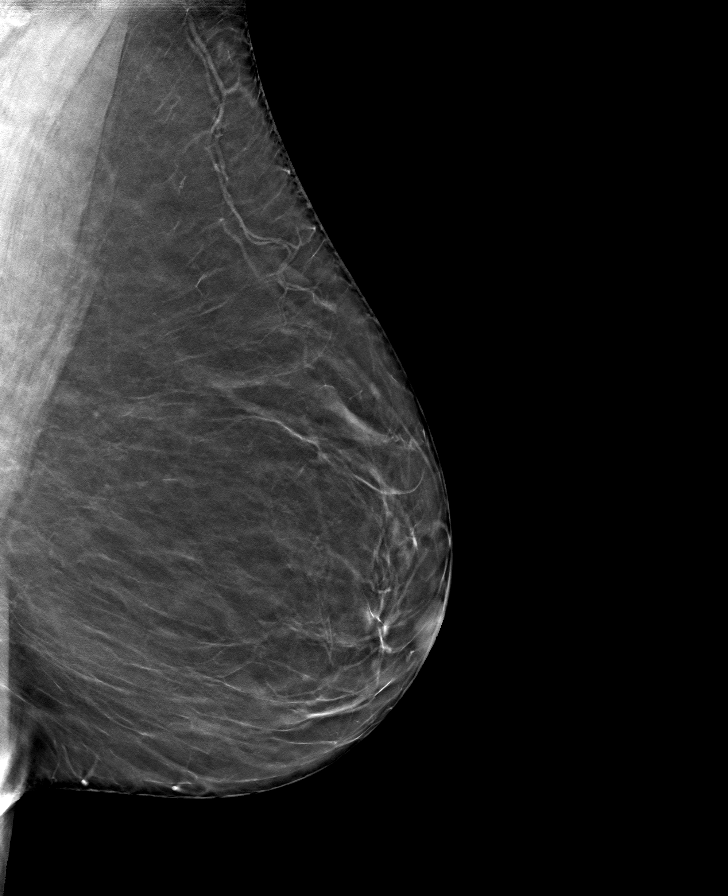

[R MLO tomo · tomo slice 39/77.0]
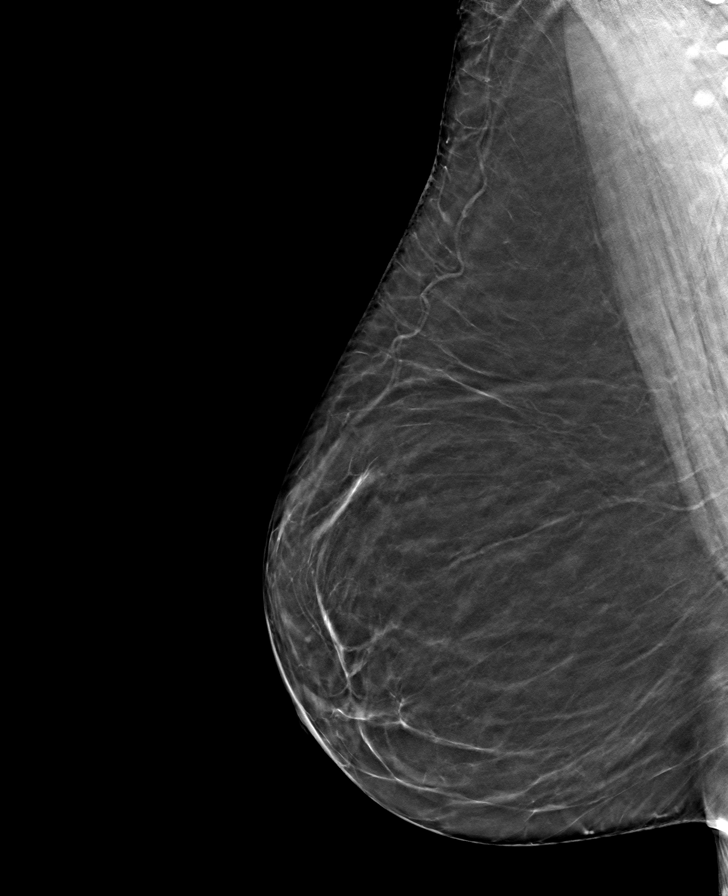

[L CC tomo · tomo slice 34/67.0]
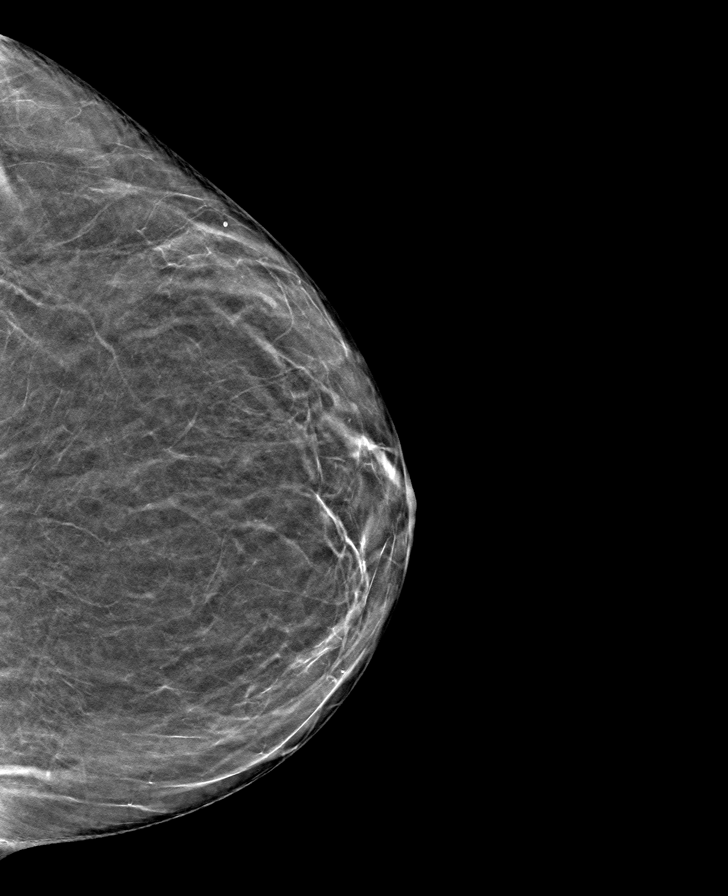

[8 of 24 positions shown; findings below may reference images not displayed]

ACR Breast Density Category b: There are scattered areas of
fibroglandular density.
FINDINGS: There are no findings suspicious for malignancy.
IMPRESSION: No mammographic evidence of malignancy. A result letter of this
screening mammogram will be mailed directly to the patient.

RECOMMENDATION:
Screening mammogram in one year. (Code:51-O-LD2)

BI-RADS CATEGORY  1: Negative.

## 2022-05-16 ENCOUNTER — Other Ambulatory Visit (HOSPITAL_COMMUNITY): Payer: Self-pay

## 2022-06-18 ENCOUNTER — Other Ambulatory Visit (HOSPITAL_COMMUNITY): Payer: Self-pay

## 2022-06-26 ENCOUNTER — Other Ambulatory Visit (HOSPITAL_COMMUNITY): Payer: Self-pay

## 2022-06-26 MED ORDER — CEPHALEXIN 500 MG PO CAPS
500.0000 mg | ORAL_CAPSULE | Freq: Three times a day (TID) | ORAL | 0 refills | Status: DC
  Filled 2022-06-26: qty 21, 7d supply, fill #0

## 2022-08-21 ENCOUNTER — Other Ambulatory Visit (HOSPITAL_COMMUNITY): Payer: Self-pay

## 2022-09-11 ENCOUNTER — Other Ambulatory Visit (HOSPITAL_COMMUNITY): Payer: Self-pay

## 2022-09-17 ENCOUNTER — Other Ambulatory Visit: Payer: Self-pay | Admitting: Internal Medicine

## 2022-09-17 DIAGNOSIS — Z0000 Encounter for general adult medical examination without abnormal findings: Secondary | ICD-10-CM

## 2022-12-19 ENCOUNTER — Other Ambulatory Visit (HOSPITAL_COMMUNITY): Payer: Self-pay

## 2023-01-26 ENCOUNTER — Other Ambulatory Visit: Payer: Self-pay

## 2023-01-26 ENCOUNTER — Ambulatory Visit
Admission: EM | Admit: 2023-01-26 | Discharge: 2023-01-26 | Disposition: A | Discharge status: Home / Self Care | Payer: 59 | Attending: Internal Medicine | Admitting: Internal Medicine

## 2023-01-26 ENCOUNTER — Encounter: Payer: Self-pay | Admitting: *Deleted

## 2023-01-26 DIAGNOSIS — H1013 Acute atopic conjunctivitis, bilateral: Secondary | ICD-10-CM | POA: Diagnosis not present

## 2023-01-26 DIAGNOSIS — J309 Allergic rhinitis, unspecified: Secondary | ICD-10-CM | POA: Diagnosis not present

## 2023-01-26 MED ORDER — PROMETHAZINE-DM 6.25-15 MG/5ML PO SYRP: 5.0000 mL | ORAL_SOLUTION | Freq: Four times a day (QID) | ORAL | 0 refills | Status: AC | PRN

## 2023-01-26 MED ORDER — FLUTICASONE PROPIONATE 50 MCG/ACT NA SUSP
1.0000 | Freq: Every day | NASAL | 0 refills | Status: AC
Start: 2023-01-26 — End: ?

## 2023-01-26 NOTE — ED Triage Notes (Signed)
Cough and red, itchy eyes since Thursday. Intermittent sore throat and nasal congestion. Using mucinex and patanol eye drops

## 2023-01-26 NOTE — Discharge Instructions (Addendum)
Humidifier and VapoRub use will help with nasal congestion and postnasal drainage Please take medications as prescribed If you have worsening symptoms please return to urgent care to be reevaluated Your symptoms are likely related to environmental allergens.

## 2023-01-26 NOTE — ED Provider Notes (Signed)
[facility]    CSN: [id number] Arrival date & time: 01/26/23  0901      History   Chief Complaint Chief Complaint  Patient presents with   Cough   Conjunctivitis    HPI Jenna Fowler is a 72 y.o. female with a history of seasonal allergies comes to urgent care with worsening itchy eyes, redness of the eyes, nasal congestion, postnasal drainage and cough.  Patient's symptoms have worsened over the past few days.  She takes Zyrtec daily and has been using Patanol eyedrops over the past couple of days as well as Mucinex.  Patient denies any shortness of breath, chest pain or wheezing.  No fever or chills.  No dizziness, near-syncope or syncopal episodes.   HPI  Past Medical History:  Diagnosis Date   Allergy    occasionally   Cataract    watching the beggining of one   Costochondritis, acute    of chest wall   GERD (gastroesophageal reflux disease)    Hyperlipidemia    Hypertension    Osteoarthritis    Thyroid disease     Patient Active Problem List   Diagnosis Date Noted   Vertigo 09/19/2021   Dry nose 06/16/2017   Epistaxis 06/16/2017   Ingrown toenail 01/06/2017   Onychomycosis 01/06/2017   Knee pain 02/21/2012   Disorder of bursae and tendons in shoulder region 04/09/2010   Ganglion 04/09/2010   HYPERCALCEMIA 04/05/2010    Past Surgical History:  Procedure Laterality Date   COLONOSCOPY  2018   DILATION AND CURETTAGE OF UTERUS     POLYPECTOMY     THYROIDECTOMY      OB History   No obstetric history on file.      Home Medications    Prior to Admission medications   Medication Sig Start Date End Date Taking? Authorizing Provider  amLODipine (NORVASC) 2.5 MG tablet Take 1 tablet (2.5 mg total) by mouth daily. 03/29/22  Yes   aspirin 81 MG tablet Take 81 mg by mouth daily. "Once a week"   Yes [provider]  BIOTIN PO Take 1,000 mg by mouth daily.   Yes [provider]  cetirizine (ZYRTEC) 10 MG tablet Take 10 mg by  mouth daily.   Yes [provider]  Cholecalciferol 25 MCG (1000 UT) capsule Take 1,000 Units by mouth daily. 11/19/13  Yes [provider]  Chromium Picolinate 800 MCG TABS Take 800 mcg by mouth daily. With cinnamon   Yes [provider]  CINNAMON PO Take 2,000 mg by mouth daily.   Yes [provider]  Cyanocobalamin (VITAMIN B12) 1000 MCG TBCR 1 tablet   Yes [provider]  fluticasone (FLONASE) 50 MCG/ACT nasal spray Place 1 spray into both nostrils daily. 01/26/23  Yes Gustavia Carie, Britta Mccreedy, MD  levothyroxine (SYNTHROID) 100 MCG tablet Take 1 tablet (100 mcg total) by mouth in the morning on an empty stomach 03/29/22  Yes   meclizine (ANTIVERT) 12.5 MG tablet Take 1 tablet (12.5 mg total) by mouth every 12 (twelve) hours as needed. 03/29/22  Yes   promethazine-dextromethorphan (PROMETHAZINE-DM) 6.25-15 MG/5ML syrup Take 5 mLs by mouth 4 (four) times daily as needed for cough. 01/26/23  Yes Esra Frankowski, Britta Mccreedy, MD  simvastatin (ZOCOR) 20 MG tablet Take 1 tablet (20 mg total) by mouth every evening. 03/29/22  Yes   amLODipine (NORVASC) 2.5 MG tablet Take 2.5 mg by mouth daily.    [provider]  amLODipine (NORVASC) 2.5 MG tablet  Take 1 tablet (2.5 mg total) by mouth daily. 02/27/21     amLODipine (NORVASC) 2.5 MG tablet Take 1 tablet (2.5 mg total) by mouth daily. 08/28/21     COVID-19 mRNA bivalent vaccine, Pfizer, (PFIZER COVID-19 VAC BIVALENT) injection Inject into the muscle. 03/07/21   Judyann Munson, MD  ezetimibe-simvastatin (VYTORIN) 10-20 MG per tablet Take 1 tablet by mouth at bedtime.    [provider]  famotidine (PEPCID) 10 MG tablet 1 tablet as needed    [provider]  levothyroxine (SYNTHROID) 100 MCG tablet TAKE 1 TABLET BY MOUTH ONCE DAILY IN THE MORNING ON AN EMPTY STOMACH 06/14/20 06/14/21  Lorenda Ishihara, MD  levothyroxine (SYNTHROID) 100 MCG tablet Take 1 tablet (100 mcg total) by mouth in the morning on  an empty stomach 03/15/22     meclizine (ANTIVERT) 25 MG tablet Take 1 tablet (25 mg total) by mouth 3 (three) times daily as needed for dizziness. 09/19/21   Katsadouros, Vasilios, MD  meloxicam (MOBIC) 7.5 MG tablet Take 1 tablet (7.5 mg total) by mouth daily as needed. 02/27/21     simvastatin (ZOCOR) 20 MG tablet TAKE 1 TABLET BY MOUTH EVERY EVENING 02/28/20 02/27/21  Lorenda Ishihara, MD  simvastatin (ZOCOR) 20 MG tablet Take 1 tablet (20 mg total) by mouth every evening. 08/28/21     Vitamin D, Cholecalciferol, 1000 UNITS CAPS Take 1,000 Units by mouth daily.    [provider]    Family History Family History  Problem Relation Age of Onset   Pancreatic cancer Maternal Aunt    Pancreatic cancer Maternal Grandfather    Colon cancer Neg Hx    Rectal cancer Neg Hx    Stomach cancer Neg Hx    Esophageal cancer Neg Hx    Colon polyps Neg Hx    Breast cancer Neg Hx     Social History Social History   Tobacco Use   Smoking status: Never   Smokeless tobacco: Never  Vaping Use   Vaping status: Never Used  Substance Use Topics   Alcohol use: No    Alcohol/week: 0.0 standard drinks of alcohol   Drug use: No     Allergies   Erythromycin   Review of Systems Review of Systems As per HPI  Physical Exam Triage Vital Signs ED Triage Vitals  Encounter Vitals Group     BP 01/26/23 0930 131/88     Systolic BP Percentile --      Diastolic BP Percentile --      Pulse Rate 01/26/23 0930 (!) 53     Resp 01/26/23 0930 16     Temp 01/26/23 0930 98.1 F (36.7 C)     Temp Source 01/26/23 0930 Oral     SpO2 01/26/23 0930 100 %     Weight --      Height --      Head Circumference --      Peak Flow --      Pain Score 01/26/23 0924 3     Pain Loc --      Pain Education --      Exclude from Growth Chart --    No data found.  Updated Vital Signs BP 131/88 (BP Location: Left Arm)   Pulse (!) 53   Temp 98.1 F (36.7 C) (Oral)   Resp 16   SpO2 100%   Visual  Acuity Right Eye Distance:   Left Eye Distance:   Bilateral Distance:    Right Eye Near:  Left Eye Near:    Bilateral Near:     Physical Exam Vitals and nursing note reviewed.  Constitutional:      General: She is not in acute distress.    Appearance: She is not ill-appearing.  HENT:     Right Ear: Tympanic membrane normal.     Left Ear: Tympanic membrane normal.  Eyes:     General:        Right eye: No discharge.        Left eye: No discharge.     Extraocular Movements: Extraocular movements intact.     Pupils: Pupils are equal, round, and reactive to light.     Comments: Mild conjunctival erythema of both eyes.  Cardiovascular:     Rate and Rhythm: Normal rate and regular rhythm.  Pulmonary:     Effort: Pulmonary effort is normal.     Breath sounds: Normal breath sounds.  Abdominal:     General: Bowel sounds are normal.     Palpations: Abdomen is soft.  Neurological:     Mental Status: She is alert.      UC Treatments / Results  Labs (all labs ordered are listed, but only abnormal results are displayed) Labs Reviewed - No data to display  EKG   Radiology No results found.  Procedures Procedures (including critical care time)  Medications Ordered in UC Medications - No data to display  Initial Impression / Assessment and Plan / UC Course  I have reviewed the triage vital signs and the nursing notes.  Pertinent labs & imaging results that were available during my care of the patient were reviewed by me and considered in my medical decision making (see chart for details).     1.  Allergic rhinoconjunctivitis of both eyes: Humidifier and VapoRub use is recommended Flonase use daily Promethazine DM every 4-6 hours as needed for cough Return precautions given. Final Clinical Impressions(s) / UC Diagnoses   Final diagnoses:  Allergic rhinoconjunctivitis of both eyes     Discharge Instructions      Humidifier and VapoRub use will help with nasal  congestion and postnasal drainage Please take medications as prescribed If you have worsening symptoms please return to urgent care to be reevaluated Your symptoms are likely related to environmental allergens.   ED Prescriptions     Medication Sig Dispense Auth. Provider   fluticasone (FLONASE) 50 MCG/ACT nasal spray Place 1 spray into both nostrils daily. 16 g Merrilee Jansky, MD   promethazine-dextromethorphan (PROMETHAZINE-DM) 6.25-15 MG/5ML syrup Take 5 mLs by mouth 4 (four) times daily as needed for cough. 118 mL Livian Vanderbeck, Britta Mccreedy, MD      PDMP not reviewed this encounter.   Merrilee Jansky, MD 01/26/23 1019

## 2023-02-10 ENCOUNTER — Other Ambulatory Visit (HOSPITAL_COMMUNITY): Payer: Self-pay

## 2023-02-10 MED ORDER — AMOXICILLIN 500 MG PO CAPS
ORAL_CAPSULE | ORAL | 1 refills | Status: AC
  Filled 2023-02-10: qty 21, 7d supply, fill #0

## 2023-02-17 ENCOUNTER — Other Ambulatory Visit (HOSPITAL_COMMUNITY): Payer: Self-pay

## 2023-03-17 ENCOUNTER — Other Ambulatory Visit (HOSPITAL_COMMUNITY): Payer: Self-pay

## 2023-04-04 ENCOUNTER — Other Ambulatory Visit (HOSPITAL_COMMUNITY): Payer: Self-pay

## 2023-04-04 DIAGNOSIS — M19019 Primary osteoarthritis, unspecified shoulder: Secondary | ICD-10-CM | POA: Diagnosis not present

## 2023-04-04 DIAGNOSIS — Z0000 Encounter for general adult medical examination without abnormal findings: Secondary | ICD-10-CM | POA: Diagnosis not present

## 2023-04-04 DIAGNOSIS — K219 Gastro-esophageal reflux disease without esophagitis: Secondary | ICD-10-CM | POA: Diagnosis not present

## 2023-04-04 DIAGNOSIS — E785 Hyperlipidemia, unspecified: Secondary | ICD-10-CM | POA: Diagnosis not present

## 2023-04-04 DIAGNOSIS — E039 Hypothyroidism, unspecified: Secondary | ICD-10-CM | POA: Diagnosis not present

## 2023-04-04 DIAGNOSIS — M19041 Primary osteoarthritis, right hand: Secondary | ICD-10-CM | POA: Diagnosis not present

## 2023-04-04 DIAGNOSIS — J301 Allergic rhinitis due to pollen: Secondary | ICD-10-CM | POA: Diagnosis not present

## 2023-04-04 DIAGNOSIS — R7303 Prediabetes: Secondary | ICD-10-CM | POA: Diagnosis not present

## 2023-04-04 DIAGNOSIS — I10 Essential (primary) hypertension: Secondary | ICD-10-CM | POA: Diagnosis not present

## 2023-04-04 MED ORDER — MELOXICAM 7.5 MG PO TABS
7.5000 mg | ORAL_TABLET | Freq: Every day | ORAL | 0 refills | Status: AC | PRN
  Filled 2023-04-04: qty 20, 20d supply, fill #0

## 2023-04-04 MED ORDER — LEVOTHYROXINE SODIUM 100 MCG PO TABS
100.0000 ug | ORAL_TABLET | Freq: Every morning | ORAL | 5 refills | Status: DC
  Filled 2023-04-04 – 2023-06-12 (×2): qty 90, 90d supply, fill #0
  Filled 2023-09-12: qty 90, 90d supply, fill #1
  Filled 2023-12-17: qty 90, 90d supply, fill #2
  Filled 2024-03-21: qty 30, 30d supply, fill #3
  Filled 2024-03-23: qty 90, 90d supply, fill #3

## 2023-04-04 MED ORDER — SIMVASTATIN 20 MG PO TABS
20.0000 mg | ORAL_TABLET | Freq: Every evening | ORAL | 5 refills | Status: DC
  Filled 2023-04-04 – 2023-09-03 (×2): qty 90, 90d supply, fill #0
  Filled 2023-11-28: qty 90, 90d supply, fill #1
  Filled 2024-02-23: qty 90, 90d supply, fill #2

## 2023-04-04 MED ORDER — AMLODIPINE BESYLATE 2.5 MG PO TABS
2.5000 mg | ORAL_TABLET | Freq: Every day | ORAL | 5 refills | Status: DC
  Filled 2023-04-04 – 2023-05-20 (×2): qty 90, 90d supply, fill #0
  Filled 2023-08-13: qty 90, 90d supply, fill #1
  Filled 2023-11-14: qty 90, 90d supply, fill #2
  Filled 2024-02-23: qty 90, 90d supply, fill #3

## 2023-05-12 ENCOUNTER — Ambulatory Visit
Admission: RE | Admit: 2023-05-12 | Discharge: 2023-05-12 | Disposition: A | Discharge status: Home / Self Care | Payer: Commercial Managed Care - PPO | Source: Ambulatory Visit | Attending: Internal Medicine | Admitting: Internal Medicine

## 2023-05-12 DIAGNOSIS — Z1231 Encounter for screening mammogram for malignant neoplasm of breast: Secondary | ICD-10-CM | POA: Diagnosis not present

## 2023-05-12 DIAGNOSIS — Z0000 Encounter for general adult medical examination without abnormal findings: Secondary | ICD-10-CM

## 2023-05-20 ENCOUNTER — Other Ambulatory Visit (HOSPITAL_COMMUNITY): Payer: Self-pay

## 2023-06-12 ENCOUNTER — Other Ambulatory Visit (HOSPITAL_COMMUNITY): Payer: Self-pay

## 2023-06-16 ENCOUNTER — Other Ambulatory Visit (HOSPITAL_COMMUNITY): Payer: Self-pay

## 2023-08-13 ENCOUNTER — Other Ambulatory Visit (HOSPITAL_COMMUNITY): Payer: Self-pay

## 2023-09-03 ENCOUNTER — Other Ambulatory Visit (HOSPITAL_COMMUNITY): Payer: Self-pay

## 2023-09-12 ENCOUNTER — Other Ambulatory Visit (HOSPITAL_COMMUNITY): Payer: Self-pay

## 2023-10-23 DIAGNOSIS — J301 Allergic rhinitis due to pollen: Secondary | ICD-10-CM | POA: Diagnosis not present

## 2023-10-23 DIAGNOSIS — I10 Essential (primary) hypertension: Secondary | ICD-10-CM | POA: Diagnosis not present

## 2023-10-23 DIAGNOSIS — M19041 Primary osteoarthritis, right hand: Secondary | ICD-10-CM | POA: Diagnosis not present

## 2023-10-23 DIAGNOSIS — M19049 Primary osteoarthritis, unspecified hand: Secondary | ICD-10-CM | POA: Diagnosis not present

## 2023-10-23 DIAGNOSIS — E039 Hypothyroidism, unspecified: Secondary | ICD-10-CM | POA: Diagnosis not present

## 2023-10-23 DIAGNOSIS — R7303 Prediabetes: Secondary | ICD-10-CM | POA: Diagnosis not present

## 2023-11-14 ENCOUNTER — Other Ambulatory Visit (HOSPITAL_COMMUNITY): Payer: Self-pay

## 2023-11-28 ENCOUNTER — Other Ambulatory Visit (HOSPITAL_COMMUNITY): Payer: Self-pay

## 2023-12-17 ENCOUNTER — Other Ambulatory Visit (HOSPITAL_COMMUNITY): Payer: Self-pay

## 2023-12-31 DIAGNOSIS — H43823 Vitreomacular adhesion, bilateral: Secondary | ICD-10-CM | POA: Diagnosis not present

## 2023-12-31 DIAGNOSIS — H2513 Age-related nuclear cataract, bilateral: Secondary | ICD-10-CM | POA: Diagnosis not present

## 2023-12-31 DIAGNOSIS — H04123 Dry eye syndrome of bilateral lacrimal glands: Secondary | ICD-10-CM | POA: Diagnosis not present

## 2024-02-23 ENCOUNTER — Other Ambulatory Visit (HOSPITAL_COMMUNITY): Payer: Self-pay

## 2024-03-05 DIAGNOSIS — H52221 Regular astigmatism, right eye: Secondary | ICD-10-CM | POA: Diagnosis not present

## 2024-03-05 DIAGNOSIS — H25811 Combined forms of age-related cataract, right eye: Secondary | ICD-10-CM | POA: Diagnosis not present

## 2024-03-22 ENCOUNTER — Other Ambulatory Visit: Payer: Self-pay

## 2024-03-22 ENCOUNTER — Other Ambulatory Visit (HOSPITAL_COMMUNITY): Payer: Self-pay

## 2024-03-23 ENCOUNTER — Other Ambulatory Visit: Payer: Self-pay

## 2024-03-23 ENCOUNTER — Other Ambulatory Visit (HOSPITAL_COMMUNITY): Payer: Self-pay

## 2024-03-24 ENCOUNTER — Other Ambulatory Visit: Payer: Self-pay | Admitting: Internal Medicine

## 2024-03-24 DIAGNOSIS — Z1231 Encounter for screening mammogram for malignant neoplasm of breast: Secondary | ICD-10-CM

## 2024-04-01 ENCOUNTER — Other Ambulatory Visit: Payer: Self-pay

## 2024-04-01 MED ORDER — VALACYCLOVIR HCL 1 G PO TABS
1000.0000 mg | ORAL_TABLET | Freq: Three times a day (TID) | ORAL | 1 refills | Status: AC
  Filled 2024-04-01: qty 90, 30d supply, fill #0

## 2024-04-14 ENCOUNTER — Other Ambulatory Visit: Payer: Self-pay

## 2024-04-14 MED ORDER — LEVOTHYROXINE SODIUM 100 MCG PO TABS
100.0000 ug | ORAL_TABLET | Freq: Every morning | ORAL | 5 refills | Status: AC
  Filled 2024-04-14: qty 90, 90d supply, fill #0

## 2024-04-14 MED ORDER — AMLODIPINE BESYLATE 2.5 MG PO TABS
2.5000 mg | ORAL_TABLET | Freq: Every day | ORAL | 5 refills | Status: AC
  Filled 2024-04-14: qty 90, 90d supply, fill #0

## 2024-04-14 MED ORDER — SIMVASTATIN 20 MG PO TABS
20.0000 mg | ORAL_TABLET | Freq: Every day | ORAL | 5 refills | Status: AC
  Filled 2024-04-14: qty 90, 90d supply, fill #0

## 2024-04-14 MED ORDER — CEPHALEXIN 500 MG PO CAPS
500.0000 mg | ORAL_CAPSULE | Freq: Four times a day (QID) | ORAL | 0 refills | Status: AC
  Filled 2024-04-14: qty 28, 7d supply, fill #0

## 2024-05-12 ENCOUNTER — Ambulatory Visit
